# Patient Record
Sex: Female | Born: 1955 | Race: White | Hispanic: No | Marital: Married | Smoking: Never smoker
Health system: Southern US, Community
[De-identification: ages and names within clinical notes are randomized; demographics above are authoritative.]

## PROBLEM LIST (undated history)

## (undated) DIAGNOSIS — I10 Essential (primary) hypertension: Secondary | ICD-10-CM

## (undated) DIAGNOSIS — I8392 Asymptomatic varicose veins of left lower extremity: Secondary | ICD-10-CM

## (undated) DIAGNOSIS — L309 Dermatitis, unspecified: Secondary | ICD-10-CM

## (undated) DIAGNOSIS — J45909 Unspecified asthma, uncomplicated: Secondary | ICD-10-CM

## (undated) DIAGNOSIS — M545 Low back pain, unspecified: Secondary | ICD-10-CM

## (undated) DIAGNOSIS — M722 Plantar fascial fibromatosis: Secondary | ICD-10-CM

## (undated) DIAGNOSIS — K589 Irritable bowel syndrome without diarrhea: Secondary | ICD-10-CM

## (undated) DIAGNOSIS — F419 Anxiety disorder, unspecified: Secondary | ICD-10-CM

## (undated) DIAGNOSIS — K760 Fatty (change of) liver, not elsewhere classified: Secondary | ICD-10-CM

## (undated) DIAGNOSIS — E559 Vitamin D deficiency, unspecified: Secondary | ICD-10-CM

## (undated) DIAGNOSIS — F329 Major depressive disorder, single episode, unspecified: Secondary | ICD-10-CM

## (undated) DIAGNOSIS — E7800 Pure hypercholesterolemia, unspecified: Secondary | ICD-10-CM

## (undated) DIAGNOSIS — F32A Depression, unspecified: Secondary | ICD-10-CM

## (undated) DIAGNOSIS — E119 Type 2 diabetes mellitus without complications: Secondary | ICD-10-CM

## (undated) DIAGNOSIS — I700 Atherosclerosis of aorta: Secondary | ICD-10-CM

## (undated) HISTORY — DX: Pure hypercholesterolemia, unspecified: E78.00

## (undated) HISTORY — DX: Dermatitis, unspecified: L30.9

## (undated) HISTORY — DX: Irritable bowel syndrome, unspecified: K58.9

## (undated) HISTORY — PX: OTHER SURGICAL HISTORY: SHX169

## (undated) HISTORY — DX: Major depressive disorder, single episode, unspecified: F32.9

## (undated) HISTORY — DX: Plantar fascial fibromatosis: M72.2

## (undated) HISTORY — DX: Atherosclerosis of aorta: I70.0

## (undated) HISTORY — DX: Asymptomatic varicose veins of left lower extremity: I83.92

## (undated) HISTORY — DX: Anxiety disorder, unspecified: F41.9

## (undated) HISTORY — DX: Low back pain, unspecified: M54.50

## (undated) HISTORY — DX: Essential (primary) hypertension: I10

## (undated) HISTORY — DX: Low back pain: M54.5

## (undated) HISTORY — DX: Type 2 diabetes mellitus without complications: E11.9

## (undated) HISTORY — DX: Fatty (change of) liver, not elsewhere classified: K76.0

## (undated) HISTORY — PX: DILATION AND CURETTAGE OF UTERUS: SHX78

## (undated) HISTORY — DX: Vitamin D deficiency, unspecified: E55.9

## (undated) HISTORY — DX: Unspecified asthma, uncomplicated: J45.909

## (undated) HISTORY — DX: Depression, unspecified: F32.A

## (undated) HISTORY — PX: ELBOW SURGERY: SHX618

---

## 2001-09-02 ENCOUNTER — Ambulatory Visit (HOSPITAL_COMMUNITY): Admission: RE | Admit: 2001-09-02 | Discharge: 2001-09-02 | Payer: Self-pay | Admitting: Urology

## 2001-09-02 ENCOUNTER — Encounter: Payer: Self-pay | Admitting: Urology

## 2003-12-27 ENCOUNTER — Ambulatory Visit (HOSPITAL_COMMUNITY): Admission: RE | Admit: 2003-12-27 | Discharge: 2003-12-27 | Payer: Self-pay

## 2003-12-27 IMAGING — CT CT PELVIS WO/W CM
1 of 5 series · 8 of 32 positions shown, 13 images · IV contrast (CONTRAST)
Comparison: none

CLINICAL DATA: 47 year-old with recurrent urinary tract infections and hematuria.
 CT OF THE ABDOMEN AND PELVIS WITHOUT AND WITH CONTRAST ? [DATE]
 TECHNIQUE
 Initially, helical CT through the abdomen and pelvis was performed at 5mm collimation prior to IV contrast administration.  Oral contrast had been administered.  Subsequently, during the intravenous administration of [F2] Omnipaque 300, helical CT through the abdomen and pelvis was performed.  Delayed helical 5mm images through the liver and kidneys was performed at 5 minutes and 10 minutes after contrast administration.
 No comparison.
 CT OF THE ABDOMEN
 The unenhanced images demonstrate no renal or proximal ureteral calculi.  There is mild atherosclerotic calcification of the abdominal aorta.
 There is a large (approximately 6.1 x 6.0cm) lesion in the anterior segment of the right lobe of the liver; this has early peripheral enhancement and enhances towards the center on the delayed images, and is consistent with hepatic hemangioma.  There is a second lesion in the medial segment of the left lobe, which is also consistent with a hemangioma, as it enhances on the delayed images.  There is a small simple cyst in the far lateral segment of the left lobe, and there is a third small hemangioma in the lateral segment adjacent to the left intrahepatic portal vein.  No other solid lesions are identified in the liver.  The spleen has a normal appearance.  The pancreas is normal.  Both adrenal glands and both kidneys are normal on the enhanced images.  The stomach and the visualized large and small bowel are unremarkable in the upper abdomen.  There is no significant lymphadenopathy.  There is no free fluid.  The visualized lung bases appear clear.
 IMPRESSION
 1. No evidence of upper urinary tract calculi.
 2. Normal appearing kidneys.
 3. At least three liver hemangiomas, the largest on the order of 6cm in the anterior segment of the right lobe.  There is a hepatic cyst in the far lateral segment of the left lobe as well.
 CT OF THE PELVIS
 On the unenhanced images, there is no evidence of a distal ureteral calculus.  There are numerous phleboliths on both sides of the pelvis.  Uterus is normal in appearance for age.  There is a left ovarian cyst, which is on the order of 2.8 x 2.6cm.  The visualized colon and small bowel are unremarkable in the pelvis.  There is no free fluid.  There is no significant lymphadenopathy.
 1. No distal ureteral calculi.
 2. Normal appearing urinary bladder.
 3. Approximate 3cm left ovarian cyst.

[Series 7549: — · axial · 0.68mm/px · z∈[+1370,+1730]mm · 8 of 94 slices shown, 13 images]
[im 11/94  soft-tissue]
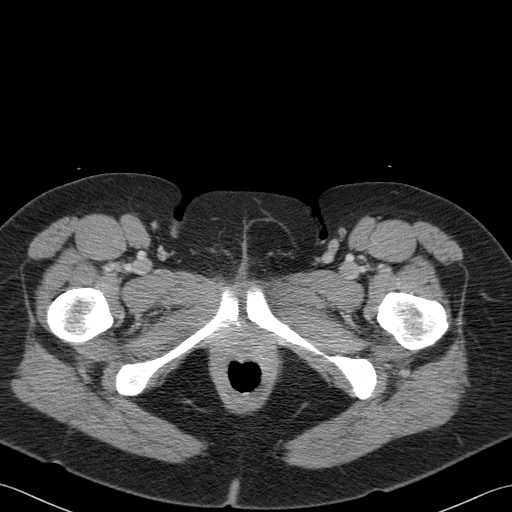
[im 11/94  bone]
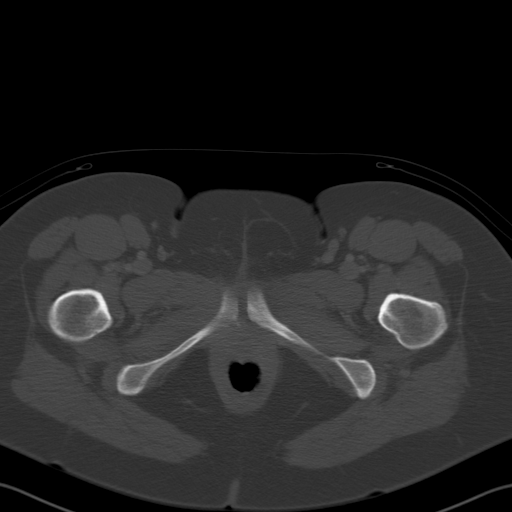
[im 21/94  soft-tissue]
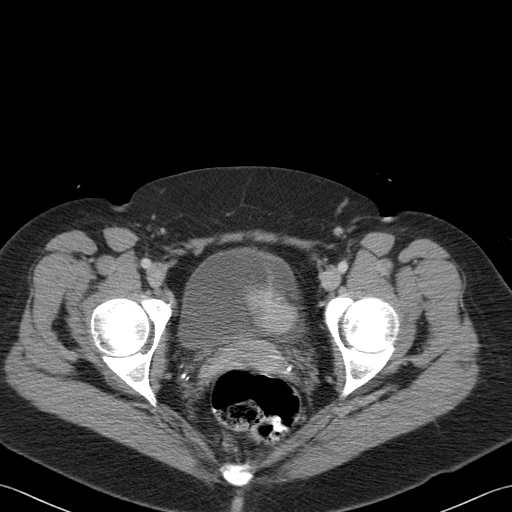
[im 32/94  soft-tissue]
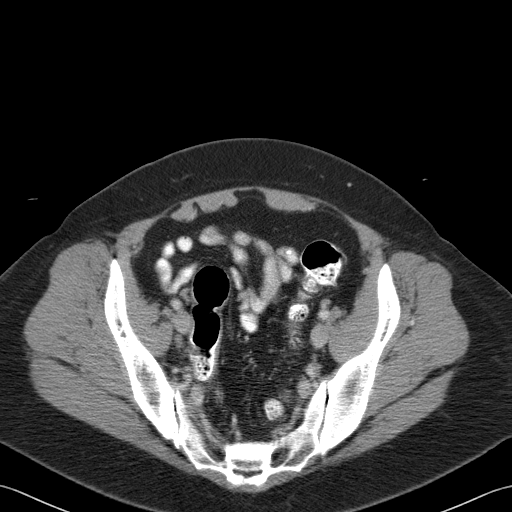
[im 42/94  soft-tissue]
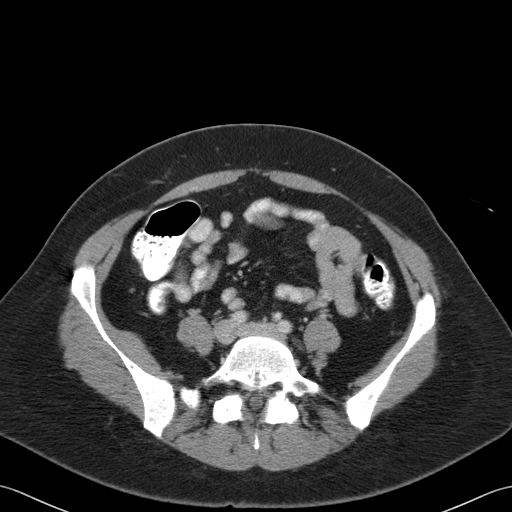
[im 52/94  soft-tissue]
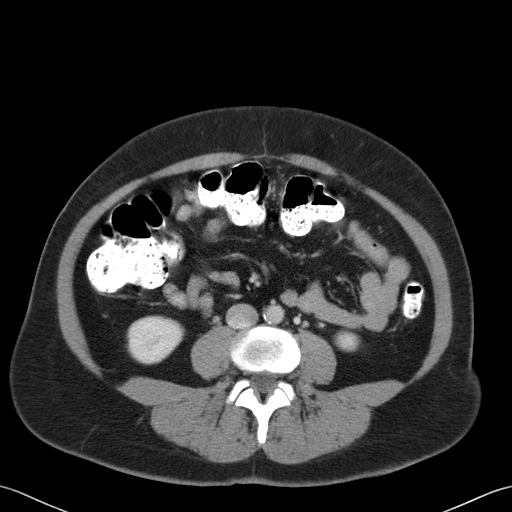
[im 52/94  lung]
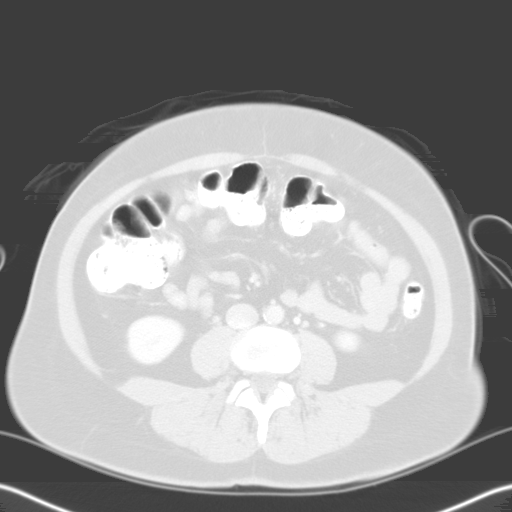
[im 63/94  soft-tissue]
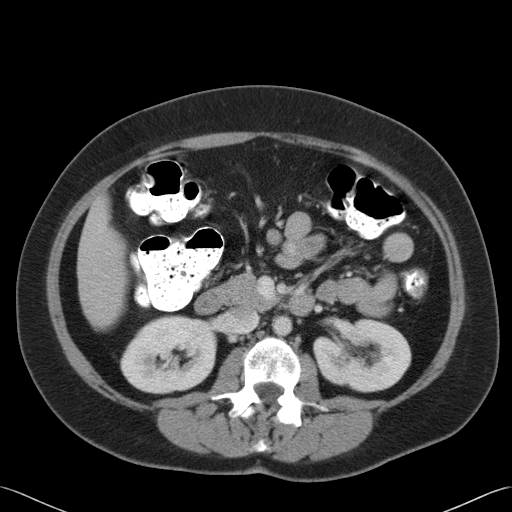
[im 63/94  lung]
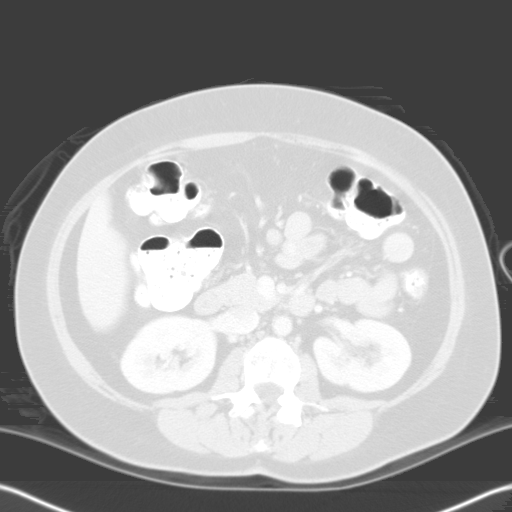
[im 73/94  soft-tissue]
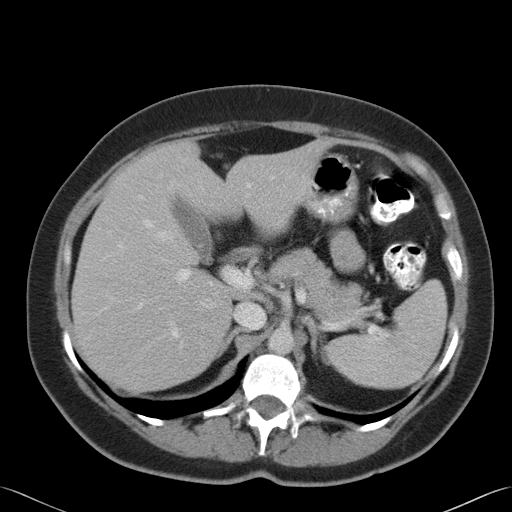
[im 73/94  lung]
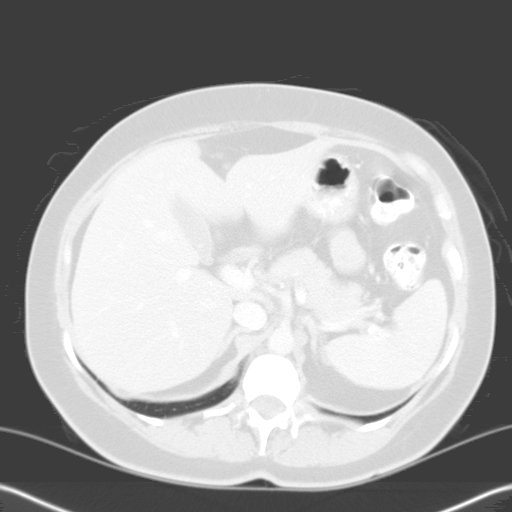
[im 83/94  soft-tissue]
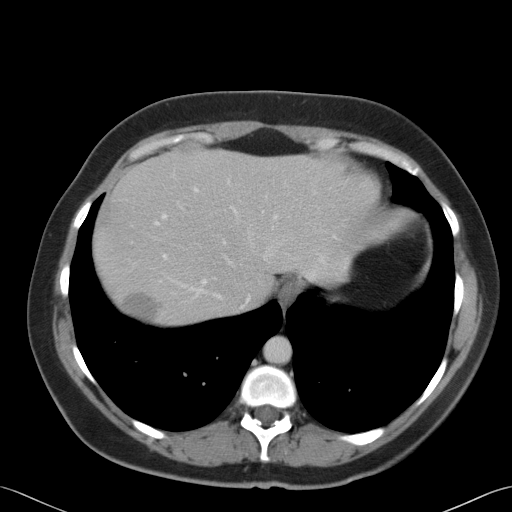
[im 83/94  lung]
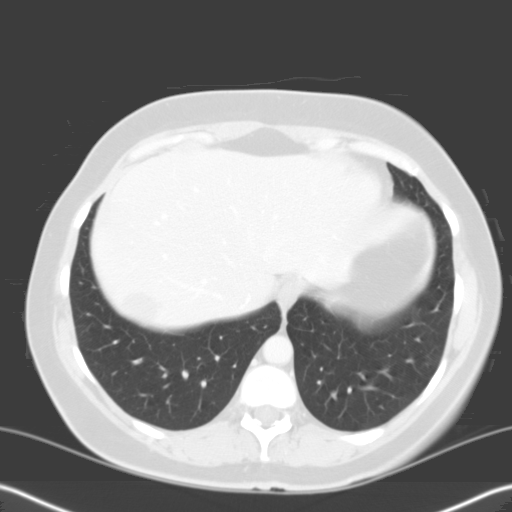

[8 of 32 positions shown; findings below may reference images not displayed]

## 2005-01-09 ENCOUNTER — Ambulatory Visit: Payer: Self-pay | Admitting: Internal Medicine

## 2005-01-16 ENCOUNTER — Ambulatory Visit: Payer: Self-pay | Admitting: *Deleted

## 2015-12-20 ENCOUNTER — Telehealth (HOSPITAL_COMMUNITY): Payer: Self-pay | Admitting: *Deleted

## 2015-12-20 NOTE — Telephone Encounter (Signed)
spoke with patient regarding an appointment.  She is scheduled at [facility], she wanted to be seen here before 02/11/16.    Appointment here will be further out.   She decided to keep appointment in [place].

## 2015-12-20 NOTE — Telephone Encounter (Signed)
left voice message regarding appointment. 

## 2016-02-11 ENCOUNTER — Ambulatory Visit (INDEPENDENT_AMBULATORY_CARE_PROVIDER_SITE_OTHER): Payer: Self-pay | Admitting: Psychiatry

## 2016-02-11 ENCOUNTER — Encounter (HOSPITAL_COMMUNITY): Payer: Self-pay | Admitting: Psychiatry

## 2016-02-11 VITALS — BP 140/88 | HR 68 | Ht 63.0 in | Wt 167.0 lb

## 2016-02-11 DIAGNOSIS — K760 Fatty (change of) liver, not elsewhere classified: Secondary | ICD-10-CM

## 2016-02-11 DIAGNOSIS — F411 Generalized anxiety disorder: Secondary | ICD-10-CM | POA: Insufficient documentation

## 2016-02-11 DIAGNOSIS — F332 Major depressive disorder, recurrent severe without psychotic features: Secondary | ICD-10-CM | POA: Insufficient documentation

## 2016-02-11 MED ORDER — PAROXETINE HCL 20 MG PO TABS: 20.0000 mg | ORAL_TABLET | Freq: Every day | ORAL | 3 refills | Status: DC

## 2016-02-11 NOTE — Progress Notes (Signed)
Psychiatric Initial Adult Assessment   Patient Identification: Sandra Willis MRN:  [id number] Date of Evaluation:  02/11/2016 Referral Source: Dr. Garlan Fillers Chief Complaint:   Chief Complaint    Depression     Visit Diagnosis:    ICD-9-CM ICD-10-CM   1. Severe episode of recurrent major depressive disorder, without psychotic features ([place]) 296.33 F33.2 PARoxetine (PAXIL) 20 MG tablet  2. GAD (generalized anxiety disorder) 300.02 F41.1 PARoxetine (PAXIL) 20 MG tablet    History of Present Illness:  States PCP no longer felt comfortable treating pt's ongoing depression and anxiety. She referred pt to psychiatry. Pt has been taking Prozac 40mg  for 5 yrs. Pt takes Klonopin once a week.   Pt states Prozac is no longer working. It was working some but lately Prozac is not managing her symptoms at all. Denies SE. She wakes up feeling worried, stressed, overwhelmed, sad and unmotivated. She is having low energy and states she could sleep anytime, anywhere. Pt is sleeping 7-8 hrs/night. Appetite is good. Pt has been having recent depression since December due to stress with her daughter in law. Since then symptoms have gotten worse. She feels depressed daily and level is about 8/10. Reports anhedonia and crying spells. Denies hopelessness and worthlessness. Denies SI/HI.   Pt has rare (2-3x/year) stress induced panic attacks.   Pt is anxious and nervous all the time. She has a hard time coping with everyday activities. Reports racing thoughts, body aches, inability to relax. She is easily overwhelmed and about once a week takes Klonopin. It helps her relax and causes her to feel sleepy. Prozac is not helping.   Associated Signs/Symptoms: Depression Symptoms:  depressed mood, anhedonia, hypersomnia, fatigue, anxiety, loss of energy/fatigue,   (Hypo) Manic Symptoms:  negative  Denies manic and hypomanic symptoms including periods of decreased need for sleep, increased energy, mood lability,  impulsivity, FOI, and excessive spending.    Anxiety Symptoms:  Excessive Worry, Panic Symptoms, denies symptoms of OCD, social anxiety and specific phobias   Psychotic Symptoms:  negative   PTSD Symptoms:Negative  Father died of stomach anuerysm suddenly. It lead to pt only psych hospitalization    Past Psychiatric History:  Dx: Depression and anxiety dx in her early 67's Meds: Zoloft, Celexa, has been on Prozac for 12 yrs Previous psychiatrist/therapist: saw one but can't recall name, PCP has been managing her depression Hospitalizations: [place] in 33 due to anxiety and depression SIB: denies Suicide attempts: denies Hx of violent behavior towards others: denies Current access to guns: one handgun that is locked up Hx of abuse: denies Military Hx: denies Hx of Seizures: denies Hx of TBI: denies   Previous Psychotropic Medications: Yes   Substance Abuse History in the last 12 months:  No.  Consequences of Substance Abuse: Negative  Past Medical History:  Past Medical History:  Diagnosis Date  . Anxiety   . Depression   . Fatty liver disease, nonalcoholic    Pt is under care of specialist  . High blood pressure   . High cholesterol     Past Surgical History:  Procedure Laterality Date  . DILATION AND CURETTAGE OF UTERUS    . ELBOW SURGERY    . hemeroidectomy      Family Psychiatric and Medical History:  Family History  Problem Relation Age of Onset  . Alcohol abuse Brother   . Personality disorder Sister   . Drug abuse Sister   . Alcohol abuse Father     Social History:  Social History   Social History  . Marital status: Married    Spouse name: N/A  . Number of children: N/A  . Years of education: N/A   Social History Main Topics  . Smoking status: Never Smoker  . Smokeless tobacco: Never Used  . Alcohol use No  . Drug use: No  . Sexual activity: Yes    Partners: Male   Other Topics Concern  . None   Social History Narrative   Pt  lives in [place] with her husband. Pt has 2 adult children and has been married 93 yrs. They got married on her 71th birthday. Born and raised in [place] by her parents. Pt has one older brother and one older sister. 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     Allergies:   Allergies  Allergen Reactions  . Statins Other (See Comments)    Diffuse pain  . Sulfa Antibiotics Rash  . Zithromax [Azithromycin] Rash    Metabolic Disorder Labs: No results found for: HGBA1C, MPG No results found for: PROLACTIN No results found for: CHOL, TRIG, HDL, CHOLHDL, VLDL, LDLCALC   Current Medications: Current Outpatient Prescriptions  Medication Sig Dispense Refill  . atenolol (TENORMIN) 25 MG tablet Take 12.5 mg by mouth daily.    . clonazePAM (KLONOPIN) 0.5 MG tablet Take 0.5 mg by mouth 2 (two) times daily as needed for anxiety.    Marland Kitchen FLUoxetine (PROZAC) 40 MG capsule Take 40 mg by mouth daily.     No current facility-administered medications for this visit.     Neurologic: Headache: No Seizure: No Paresthesias:No  Musculoskeletal: Strength & Muscle Tone: within normal limits Gait & Station: normal Patient leans: straight  Psychiatric Specialty Exam: Review of Systems  Constitutional: Negative for chills and fever.  HENT: Positive for congestion. Negative for ear pain, nosebleeds and sore throat.   Eyes: Negative for blurred vision, double vision, pain and redness.  Respiratory: Positive for cough and sputum production. Negative for hemoptysis, shortness of breath and wheezing.   Cardiovascular: Positive for leg swelling. Negative for chest pain and palpitations.  Gastrointestinal: Negative for abdominal pain, heartburn, nausea and vomiting.  Musculoskeletal: Positive for joint pain and myalgias. Negative for back pain and neck pain.  Skin: Negative for itching and rash.  Neurological: Positive for dizziness. Negative for tremors, focal weakness, seizures, loss of  consciousness, weakness and headaches.  Endo/Heme/Allergies: Negative for environmental allergies.  Psychiatric/Behavioral: Positive for depression. The patient is nervous/anxious.     Blood pressure 140/88, pulse 68, height 5\' 3"  (1.6 m), weight 167 lb (75.8 kg).Body mass index is 29.58 kg/m.  General Appearance: Fairly Groomed  Eye Contact:  Good  Speech:  Clear and Coherent and Normal Rate  Volume:  Normal  Mood:  Anxious and Depressed  Affect:  Congruent  Thought Process:  Goal Directed  Orientation:  Full (Time, Place, and Person)  Thought Content:  Logical  Suicidal Thoughts:  No  Homicidal Thoughts:  No  Memory:  Immediate;   Good Recent;   Good Remote;   Good  Judgement:  Good  Insight:  Good  Psychomotor Activity:  Normal  Concentration:  Concentration: Good  Recall:  Good  Fund of Knowledge:Good  Language: Good  Akathisia:  No  Handed:  Right  AIMS (if indicated):  n/a  Assets:  Communication Skills Desire for [place] Talents/Skills Transportation  ADL's:  Intact  Cognition: WNL  Sleep:  good    Treatment Plan Summary: Medication management and Plan see below  Assessment: MDD- recurrent, severe without psychotic features; GAD   Medication management with supportive therapy. Risks/benefits and SE of the medication discussed. Pt verbalized understanding and verbal consent obtained for treatment.  Affirm with the patient that the medications are taken as ordered. Patient expressed understanding of how their medications were to be used.  Meds: d/c Prozac Klonopin 0.5mg  po BID prn anxiety Start trial of Paxil 20mg  po qHS for depression and anxiety. Due to fatty liver will use hepatic dosing of 20mg    Labs: pt will send [place] a copy of recent labs done at PCP   Therapy: brief supportive therapy provided. Discussed psychosocial stressors in detail.   Encouraged pt to develop daily  routine and work on daily goal setting as a way to improve mood symptoms.   Consultations:  Declined therapy   Pt denies SI and is at an acute low risk for suicide. Patient told to call clinic if any problems occur. Patient advised to go to ER if they should develop SI/HI, side effects, or if symptoms worsen. Has crisis numbers to call if needed. Pt verbalized understanding.  F/up in 2-3 months or sooner if needed    Charlcie Cradle, MD 8/8/20179:40 AM

## 2016-05-14 ENCOUNTER — Encounter (HOSPITAL_COMMUNITY): Payer: Self-pay | Admitting: Psychiatry

## 2016-05-14 ENCOUNTER — Ambulatory Visit (INDEPENDENT_AMBULATORY_CARE_PROVIDER_SITE_OTHER): Payer: Self-pay | Admitting: Psychiatry

## 2016-05-14 DIAGNOSIS — Z9109 Other allergy status, other than to drugs and biological substances: Secondary | ICD-10-CM

## 2016-05-14 DIAGNOSIS — Z813 Family history of other psychoactive substance abuse and dependence: Secondary | ICD-10-CM

## 2016-05-14 DIAGNOSIS — F411 Generalized anxiety disorder: Secondary | ICD-10-CM

## 2016-05-14 DIAGNOSIS — F332 Major depressive disorder, recurrent severe without psychotic features: Secondary | ICD-10-CM

## 2016-05-14 DIAGNOSIS — Z888 Allergy status to other drugs, medicaments and biological substances status: Secondary | ICD-10-CM

## 2016-05-14 DIAGNOSIS — Z811 Family history of alcohol abuse and dependence: Secondary | ICD-10-CM

## 2016-05-14 MED ORDER — CLONAZEPAM 0.5 MG PO TABS: 0.5000 mg | ORAL_TABLET | Freq: Two times a day (BID) | ORAL | 2 refills | Status: DC | PRN

## 2016-05-14 MED ORDER — PAROXETINE HCL 20 MG PO TABS: 20.0000 mg | ORAL_TABLET | Freq: Every day | ORAL | 4 refills | Status: DC

## 2016-05-14 NOTE — Progress Notes (Signed)
[place] MD/PA/NP OP Progress Note  05/14/2016 1:44 PM Sandra Willis  MRN:  [id number]  Chief Complaint:  Chief Complaint    Depression; Follow-up     Subjective:  About 3 weeks after starting Paxil depression improved and is now resolved. Pt denies depression. Denies anhedonia, isolation, crying spells, low motivation, poor hygiene, worthlessness and hopelessness. She is motivated and is doing a lot of crafts again.  Denies SI/HI.  Sleep is good and she is getting about 8 hrs/night. Appetite is increased and she has gained 6 lbs.  Energy is good. Concentration is good.   Anxiety is also significantly improved. She gets anxious when overwhelmed. It lasts for a couple of hours and then resolves. Denies panic attacks. Pt has not taken Klonopin in 3 months.    Taking meds as prescribed and reports SE of weight gain.    HPI: Depression         This is a recurrent problem.  The current episode started more than 1 month ago.   The onset quality is gradual.   The problem occurs rarely.  The most recent episode lasted 1 hour.    The problem has been rapidly improving since onset.  Associated symptoms include appetite change.  Associated symptoms include no decreased concentration, no fatigue, no helplessness, no hopelessness, does not have insomnia, not irritable, no restlessness, no decreased interest, no body aches, no myalgias, no headaches, no indigestion, not sad and no suicidal ideas.     The symptoms are aggravated by nothing.  Past treatments include SSRIs - Selective serotonin reuptake inhibitors.  Compliance with treatment is good.  Previous treatment provided significant relief.  Past medical history includes anxiety.   Anxiety  Presents for follow-up visit. Patient reports no chest pain, compulsions, confusion, decreased concentration, depressed mood, dizziness, excessive worry, feeling of choking, hyperventilation, insomnia, irritability, malaise, muscle tension, nausea, nervous/anxious  behavior, palpitations, panic, restlessness, shortness of breath or suicidal ideas. Symptoms occur rarely. The most recent episode lasted 2 hours. The severity of symptoms is mild. The quality of sleep is good. Nighttime awakenings: occasional.   Compliance with medications is 76-100%.    Visit Diagnosis:    ICD-9-CM ICD-10-CM   1. Severe episode of recurrent major depressive disorder, without psychotic features ([place]) 296.33 F33.2 PARoxetine (PAXIL) 20 MG tablet  2. GAD (generalized anxiety disorder) 300.02 F41.1 PARoxetine (PAXIL) 20 MG tablet    Past Psychiatric History:  Dx: Depression and anxiety dx in her early 1's Meds: Zoloft, Celexa, has been on Prozac for 12 yrs Previous psychiatrist/therapist: saw one but can't recall name, PCP has been managing her depression Hospitalizations: [place] in 42 due to anxiety and depression SIB: denies Suicide attempts: denies Hx of violent behavior towards others: denies Current access to guns: one handgun that is locked up Hx of abuse: denies Military Hx: denies Hx of Seizures: denies Hx of TBI: denies   Previous Psychotropic Medications: Yes   Substance Abuse History in the last 12 months:  No.  Consequences of Substance Abuse: Negative   Past Medical History:  Past Medical History:  Diagnosis Date  . Anxiety   . Depression   . Fatty liver disease, nonalcoholic    Pt is under care of specialist  . High blood pressure   . High cholesterol     Past Surgical History:  Procedure Laterality Date  . DILATION AND CURETTAGE OF UTERUS    . ELBOW SURGERY    . hemeroidectomy      Family  Psychiatric and Medical History:  Family History  Problem Relation Age of Onset  . Alcohol abuse Brother   . Personality disorder Sister   . Drug abuse Sister   . Alcohol abuse Father     Social History:  Social History   Social History  . Marital status: Married    Spouse name: N/A  . Number of children: N/A  . Years of education:  N/A   Social History Main Topics  . Smoking status: Never Smoker  . Smokeless tobacco: Never Used  . Alcohol use No  . Drug use: No  . Sexual activity: Yes    Partners: Male   Other Topics Concern  . None   Social History Narrative   Pt lives in [place] with her husband. Pt has 2 adult children and has been married 32 yrs. They got married on her 88th birthday. Born and raised in [place] by her parents. Pt has one older brother and one older sister. 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     Allergies:  Allergies  Allergen Reactions  . Statins Other (See Comments)    Diffuse pain  . Sulfa Antibiotics Rash  . Zithromax [Azithromycin] Rash    Metabolic Disorder Labs: No results found for: HGBA1C, MPG No results found for: PROLACTIN No results found for: CHOL, TRIG, HDL, CHOLHDL, VLDL, LDLCALC   Current Medications: Current Outpatient Prescriptions  Medication Sig Dispense Refill  . atenolol (TENORMIN) 25 MG tablet Take 12.5 mg by mouth daily.    . clonazePAM (KLONOPIN) 0.5 MG tablet Take 0.5 mg by mouth 2 (two) times daily as needed for anxiety.    Marland Kitchen PARoxetine (PAXIL) 20 MG tablet Take 1 tablet (20 mg total) by mouth daily. 30 tablet 3   No current facility-administered medications for this visit.       Musculoskeletal: Strength & Muscle Tone: within normal limits Gait & Station: normal Patient leans: N/A  Psychiatric Specialty Exam: Review of Systems  Constitutional: Positive for appetite change. Negative for chills, fatigue, fever, irritability and weight loss.  HENT: Positive for congestion and sinus pain. Negative for hearing loss and tinnitus.   Eyes: Negative for blurred vision, double vision, photophobia and pain.  Respiratory: Positive for cough. Negative for shortness of breath and wheezing.   Cardiovascular: Negative for chest pain, palpitations and leg swelling.  Gastrointestinal: Negative for abdominal pain, heartburn, nausea and  vomiting.  Musculoskeletal: Negative for back pain, joint pain, myalgias and neck pain.  Skin: Negative for itching and rash.  Neurological: Negative for dizziness, tremors, sensory change, seizures, loss of consciousness and headaches.  Endo/Heme/Allergies: Positive for environmental allergies. Negative for polydipsia. Does not bruise/bleed easily.  Psychiatric/Behavioral: Positive for depression. Negative for confusion, decreased concentration, hallucinations, substance abuse and suicidal ideas. The patient is not nervous/anxious and does not have insomnia.     Blood pressure 126/70, pulse 70, height 5\' 4"  (1.626 m), weight 174 lb (78.9 kg).Body mass index is 29.87 kg/m.  General Appearance: Fairly Groomed  Eye Contact:  Good  Speech:  Clear and Coherent and Normal Rate  Volume:  Normal  Mood:  Euthymic  Affect:  Congruent  Thought Process:  Goal Directed  Orientation:  Full (Time, Place, and Person)  Thought Content: WDL   Suicidal Thoughts:  No  Homicidal Thoughts:  No  Memory:  Immediate;   Good Recent;   Good Remote;   Good  Judgement:  Fair  Insight:  Fair  Psychomotor Activity:  Normal  Concentration:  Concentration: Good  Recall:  Good  Fund of Knowledge: Good  Language: Good  Akathisia:  No  Handed:  Right  AIMS (if indicated):  n/a  Assets:  Communication Skills Desire for Improvement Housing Resilience Social Support Talents/Skills Transportation Vocational/Educational  ADL's:  Intact  Cognition: WNL  Sleep:  good     Treatment Plan Summary:Medication management and Plan see below   Assessment: MDD- recurrent, severe without psychotic features; GAD   Medication management with supportive therapy. Risks/benefits and SE of the medication discussed. Pt verbalized understanding and verbal consent obtained for treatment.  Affirm with the patient that the medications are taken as ordered. Patient expressed understanding of how their medications were to be  used.  Meds: Klonopin 0.5mg  po BID prn anxiety Paxil 20mg  po qHS for depression and anxiety. Due to fatty liver will use hepatic dosing of 20mg . Advised pt on diet for weight loss   Labs: pt will send [place] a copy of recent labs done at PCP   Therapy: brief supportive therapy provided. Discussed psychosocial stressors in detail.   Encouraged pt to develop daily routine and work on daily goal setting as a way to improve mood symptoms.   Consultations:  Declined therapy   Pt denies SI and is at an acute low risk for suicide. Patient told to call clinic if any problems occur. Patient advised to go to ER if they should develop SI/HI, side effects, or if symptoms worsen. Has crisis numbers to call if needed. Pt verbalized understanding.  F/up in 3 months or sooner if needed   Charlcie Cradle, MD 05/14/2016, 1:44 PM

## 2016-09-17 ENCOUNTER — Encounter (HOSPITAL_COMMUNITY): Payer: Self-pay | Admitting: Psychiatry

## 2016-09-17 ENCOUNTER — Ambulatory Visit (INDEPENDENT_AMBULATORY_CARE_PROVIDER_SITE_OTHER): Payer: BLUE CROSS/BLUE SHIELD | Admitting: Psychiatry

## 2016-09-17 DIAGNOSIS — Z882 Allergy status to sulfonamides status: Secondary | ICD-10-CM

## 2016-09-17 DIAGNOSIS — Z811 Family history of alcohol abuse and dependence: Secondary | ICD-10-CM | POA: Diagnosis not present

## 2016-09-17 DIAGNOSIS — F411 Generalized anxiety disorder: Secondary | ICD-10-CM

## 2016-09-17 DIAGNOSIS — Z888 Allergy status to other drugs, medicaments and biological substances status: Secondary | ICD-10-CM | POA: Diagnosis not present

## 2016-09-17 DIAGNOSIS — F332 Major depressive disorder, recurrent severe without psychotic features: Secondary | ICD-10-CM

## 2016-09-17 DIAGNOSIS — Z818 Family history of other mental and behavioral disorders: Secondary | ICD-10-CM | POA: Diagnosis not present

## 2016-09-17 DIAGNOSIS — Z79899 Other long term (current) drug therapy: Secondary | ICD-10-CM | POA: Diagnosis not present

## 2016-09-17 DIAGNOSIS — Z813 Family history of other psychoactive substance abuse and dependence: Secondary | ICD-10-CM | POA: Diagnosis not present

## 2016-09-17 DIAGNOSIS — F3340 Major depressive disorder, recurrent, in remission, unspecified: Secondary | ICD-10-CM

## 2016-09-17 MED ORDER — PAROXETINE HCL 20 MG PO TABS: 20.0000 mg | ORAL_TABLET | Freq: Every day | ORAL | 3 refills | Status: DC

## 2016-09-17 NOTE — Progress Notes (Signed)
[facility] MD/PA/NP OP Progress Note  09/17/2016 1:50 PM Sandra Willis  MRN:  [id number]  Chief Complaint:  Chief Complaint    Follow-up    HPI: "I feel great".   Pt states depression has resolved. Reports anhedonia has resolved. She is back into flowers, crocheting, sewing, reading and crafts. Sleep is good. Pt reports good energy and denies low motivation. Denies hopelessness and worthlessness. Denies SI/HI. Pt states symptoms improved about 2 weeks after starting Paxil.  She denies anxiety and panic attacks. Pt last took Klonopin 2 months ago.   Taking meds as prescribed and endorsing SE of weight gain. Pt has started dieting.    Visit Diagnosis:    ICD-9-CM ICD-10-CM   1. Severe episode of recurrent major depressive disorder, without psychotic features ([place]) 296.33 F33.2 PARoxetine (PAXIL) 20 MG tablet  2. GAD (generalized anxiety disorder) 300.02 F41.1 PARoxetine (PAXIL) 20 MG tablet    Past Psychiatric History: see H&P  Past Medical History:  Past Medical History:  Diagnosis Date  . Anxiety   . Depression   . Fatty liver disease, nonalcoholic    Pt is under care of specialist  . High blood pressure   . High cholesterol     Past Surgical History:  Procedure Laterality Date  . DILATION AND CURETTAGE OF UTERUS    . ELBOW SURGERY    . hemeroidectomy      Family Psychiatric History:  Family History  Problem Relation Age of Onset  . Alcohol abuse Brother   . Personality disorder Sister   . Drug abuse Sister   . Alcohol abuse Father     Social History:  Social History   Social History  . Marital status: Married    Spouse name: N/A  . Number of children: N/A  . Years of education: N/A   Social History Main Topics  . Smoking status: Never Smoker  . Smokeless tobacco: Never Used  . Alcohol use No  . Drug use: No  . Sexual activity: Yes    Partners: Male   Other Topics Concern  . None   Social History Narrative   Pt lives in [place] with her husband. Pt has 2  adult children and has been married 18 yrs. They got married on her 15th birthday. Born and raised in [place] by her parents. Pt has one older brother and one older sister. 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     Allergies:  Allergies  Allergen Reactions  . Statins Other (See Comments)    Diffuse pain  . Sulfa Antibiotics Rash  . Zithromax [Azithromycin] Rash    Metabolic Disorder Labs: No results found for: HGBA1C, MPG No results found for: PROLACTIN No results found for: CHOL, TRIG, HDL, CHOLHDL, VLDL, LDLCALC   Current Medications: Current Outpatient Prescriptions  Medication Sig Dispense Refill  . atenolol (TENORMIN) 25 MG tablet Take 12.5 mg by mouth daily.    . clonazePAM (KLONOPIN) 0.5 MG tablet Take 1 tablet (0.5 mg total) by mouth 2 (two) times daily as needed for anxiety. 30 tablet 2  . ergocalciferol (VITAMIN D2) 50000 units capsule Take 50,000 Units by mouth once a week.    . Multiple Vitamin (MULTIVITAMIN) tablet Take 1 tablet by mouth daily.    Marland Kitchen PARoxetine (PAXIL) 20 MG tablet Take 1 tablet (20 mg total) by mouth daily. 30 tablet 4   No current facility-administered medications for this visit.       Musculoskeletal: Strength &  Muscle Tone: within normal limits Gait & Station: normal Patient leans: N/A  Psychiatric Specialty Exam: Review of Systems  Constitutional: Negative for chills, fever and malaise/fatigue.  HENT: Negative for congestion, nosebleeds, sinus pain, sore throat and tinnitus.   Neurological: Negative for weakness.  Psychiatric/Behavioral: Negative for depression, hallucinations, substance abuse and suicidal ideas. The patient is not nervous/anxious and does not have insomnia.     Blood pressure 129/82, pulse 65, resp. rate 12, height 5\' 3"  (1.6 m), weight 177 lb 3.2 oz (80.4 kg).Body mass index is 31.39 kg/m.  General Appearance: Fairly Groomed  Eye Contact:  Good  Speech:  Clear and Coherent and Normal Rate  Volume:   Normal  Mood:  Euthymic  Affect:  Congruent  Thought Process:  Goal Directed and Descriptions of Associations: Intact  Orientation:  Full (Time, Place, and Person)  Thought Content: WDL   Suicidal Thoughts:  No  Homicidal Thoughts:  No  Memory:  Immediate;   Good Recent;   Good Remote;   Good  Judgement:  Good  Insight:  Good  Psychomotor Activity:  Normal  Concentration:  Concentration: Good and Attention Span: Good  Recall:  Good  Fund of Knowledge: Good  Language: Good  Akathisia:  No  Handed:  Right  AIMS (if indicated):  n/a  Assets:  Communication Skills Desire for Improvement Financial Resources/Insurance Housing Leisure Time [place] Talents/Skills Transportation Vocational/Educational  ADL's:  Intact  Cognition: WNL  Sleep:  good     Treatment Plan Summary:Medication management and Plan see below   Assessment: MDD-recurrent- in early remission; GAD   Medication management with supportive therapy. Risks/benefits and SE of the medication discussed. Pt verbalized understanding and verbal consent obtained for treatment.  Affirm with the patient that the medications are taken as ordered. Patient expressed understanding of how their medications were to be used.  Meds: d/c Klonopin as pt reports she no longer has anxiety and has not taken in 2 months Will continue Paxil 20mg  po qD for depression and anxiety as pt reports symptoms are stable or nearly resolved.   Labs: none   Therapy: brief supportive therapy provided. Discussed psychosocial stressors in detail.     Consultations: none  Pt denies SI and is at an acute low risk for suicide. Patient told to call clinic if any problems occur. Patient advised to go to ER if they should develop SI/HI, side effects, or if symptoms worsen. Has crisis numbers to call if needed. Pt verbalized understanding.  F/up in 3 months or sooner if needed   Charlcie Cradle, MD 09/17/2016, 1:50  PM

## 2016-11-29 ENCOUNTER — Other Ambulatory Visit (HOSPITAL_COMMUNITY): Payer: Self-pay | Admitting: Psychiatry

## 2016-11-29 DIAGNOSIS — F411 Generalized anxiety disorder: Secondary | ICD-10-CM

## 2016-11-29 DIAGNOSIS — F332 Major depressive disorder, recurrent severe without psychotic features: Secondary | ICD-10-CM

## 2016-12-17 ENCOUNTER — Ambulatory Visit (HOSPITAL_COMMUNITY): Payer: Self-pay | Admitting: Psychiatry

## 2017-03-17 ENCOUNTER — Other Ambulatory Visit (HOSPITAL_COMMUNITY): Payer: Self-pay | Admitting: Psychiatry

## 2017-03-17 DIAGNOSIS — F411 Generalized anxiety disorder: Secondary | ICD-10-CM

## 2017-03-17 DIAGNOSIS — F332 Major depressive disorder, recurrent severe without psychotic features: Secondary | ICD-10-CM

## 2017-04-20 ENCOUNTER — Other Ambulatory Visit (HOSPITAL_COMMUNITY): Payer: Self-pay | Admitting: Psychiatry

## 2017-04-20 DIAGNOSIS — F411 Generalized anxiety disorder: Secondary | ICD-10-CM

## 2017-04-20 DIAGNOSIS — F332 Major depressive disorder, recurrent severe without psychotic features: Secondary | ICD-10-CM

## 2017-07-29 ENCOUNTER — Encounter: Payer: Self-pay | Admitting: *Deleted

## 2017-07-30 ENCOUNTER — Ambulatory Visit: Payer: BLUE CROSS/BLUE SHIELD | Admitting: Diagnostic Neuroimaging

## 2017-07-30 ENCOUNTER — Encounter: Payer: Self-pay | Admitting: Diagnostic Neuroimaging

## 2017-07-30 VITALS — BP 145/87 | HR 67 | Ht 63.0 in | Wt 179.6 lb

## 2017-07-30 DIAGNOSIS — M79602 Pain in left arm: Secondary | ICD-10-CM | POA: Diagnosis not present

## 2017-07-30 DIAGNOSIS — R200 Anesthesia of skin: Secondary | ICD-10-CM | POA: Diagnosis not present

## 2017-07-30 DIAGNOSIS — M79601 Pain in right arm: Secondary | ICD-10-CM

## 2017-07-30 DIAGNOSIS — R202 Paresthesia of skin: Secondary | ICD-10-CM

## 2017-07-30 NOTE — Progress Notes (Signed)
[facility]  PATIENT: Sandra Willis DOB: 11-21-1955  REFERRING CLINICIAN: Francesco Runner, MD HISTORY FROM: patient  REASON FOR VISIT: new consult    HISTORICAL  CHIEF COMPLAINT:  Chief Complaint  Patient presents with  . NP  Dr. Woody Seller  . Carpal Tunnel    numbness/ tingling/ burning sensations in bilateral arms/ finger, w/ pain and weakness.  Started about 6 months ago.    HISTORY OF PRESENT ILLNESS:   62 year old female here for evaluation of numbness and tingling in bilateral hands.  For past 6 months patient has onset of bilateral fingers, hands, burning sensation.  This progressed to pain and weakness in her upper extremities, mainly in her bilateral elbow and triceps regions.  Patient feels weakness in her proximal upper extremities.  No problems with her feet or legs.  No low back pain.  2 years ago she had neck pain radiating to the right arm which has resolved.  Pain and numbness is limiting her day-to-day functioning.  She is not able to lift items like previously.  She is having difficulty with tasks such as knitting and crocheting.   REVIEW OF SYSTEMS: Full 14 system review of systems performed and negative with exception of: Numbness weakness too much sleep decreased energy joint pain aching muscles shortness of breath blurred vision weight gain fatigue.  ALLERGIES: Allergies  Allergen Reactions  . Statins Other (See Comments)    Diffuse pain, Simvastatin  . Sulfa Antibiotics Rash    bactrim  . Zithromax [Azithromycin] Rash    HOME MEDICATIONS: Outpatient Medications Prior to Visit  Medication Sig Dispense Refill  . aspirin EC 81 MG tablet Take 81 mg by mouth daily.    Marland Kitchen atenolol-chlorthalidone (TENORETIC) 50-25 MG tablet Take 0.5 tablets by mouth daily.    . cetirizine (ZYRTEC) 10 MG tablet Take 10 mg by mouth daily.    . diclofenac (VOLTAREN) 75 MG EC tablet Take 75 mg by mouth daily.    . ergocalciferol (VITAMIN D2) 50000 units capsule Take  50,000 Units by mouth once a week.    . fenofibrate (TRICOR) 145 MG tablet Take 145 mg by mouth daily.    . Multiple Vitamin (MULTIVITAMIN) tablet Take 1 tablet by mouth daily.    Marland Kitchen PARoxetine (PAXIL) 20 MG tablet Take 1 tablet (20 mg total) by mouth daily. 30 tablet 3  . gabapentin (NEURONTIN) 300 MG capsule Take 300 mg by mouth at bedtime.     No facility-administered medications prior to visit.     PAST MEDICAL HISTORY: Past Medical History:  Diagnosis Date  . Anxiety   . Depression   . Dermatitis    due to plants (poison ivy, sumac, oak)  . Diabetes ([place])   . Fatty liver disease, nonalcoholic    Pt is under care of specialist  . High blood pressure   . High cholesterol   . Low back pain   . Varicose veins of left lower extremity   . Vitamin D deficiency     PAST SURGICAL HISTORY: Past Surgical History:  Procedure Laterality Date  . cataracts Bilateral   . DILATION AND CURETTAGE OF UTERUS    . ELBOW SURGERY    . hemeroidectomy      FAMILY HISTORY: Family History  Problem Relation Age of Onset  . Alcohol abuse Brother   . Personality disorder Sister   . Drug abuse Sister   . Diabetes Sister   . Alcohol abuse Father   . Heart disease Father   .  High Cholesterol Father   . Hypertension Father   . Cancer Mother   . Diabetes Mother     SOCIAL HISTORY:  Social History   Socioeconomic History  . Marital status: Married    Spouse name: Not on file  . Number of children: Not on file  . Years of education: Not on file  . Highest education level: Not on file  Social Needs  . Financial resource strain: Not on file  . Food insecurity - worry: Not on file  . Food insecurity - inability: Not on file  . Transportation needs - medical: Not on file  . Transportation needs - non-medical: Not on file  Occupational History  . Not on file  Tobacco Use  . Smoking status: Never Smoker  . Smokeless tobacco: Never Used  Substance and Sexual Activity  . Alcohol use: No     Comment: quit 2000  . Drug use: No  . Sexual activity: Yes    Partners: Male  Other Topics Concern  . Not on file  Social History Narrative   Pt lives in [place] with her husband. Pt has 2 adult children and has been married 63 yrs. They got married on her 55th birthday. Born and raised in [place] by her parents. Pt has one older brother and one older sister. 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 Caffeine 2 cups coffee on sat/ sun.  Sodas 1-2 daily during week.   Education 12th grade.      PHYSICAL EXAM  GENERAL EXAM/CONSTITUTIONAL: Vitals:  Vitals:   07/30/17 0820  BP: (!) 145/87  Pulse: 67  Weight: 179 lb 9.6 oz (81.5 kg)  Height: 5\' 3"  (1.6 m)     Body mass index is 31.81 kg/m.  Visual Acuity Screening   Right eye Left eye Both eyes  Without correction: 20/40 20/50   With correction:        Patient is in no distress; well developed, nourished and groomed; neck is supple  CARDIOVASCULAR:  Examination of carotid arteries is normal; no carotid bruits  Regular rate and rhythm, no murmurs  Examination of peripheral vascular system by observation and palpation is normal  EYES:  Ophthalmoscopic exam of optic discs and posterior segments is normal; no papilledema or hemorrhages  MUSCULOSKELETAL:  Gait, strength, tone, movements noted in Neurologic exam below  NEUROLOGIC: MENTAL STATUS:  No flowsheet data found.  awake, alert, oriented to person, place and time  recent and remote memory intact  normal attention and concentration  language fluent, comprehension intact, naming intact,   fund of knowledge appropriate  CRANIAL NERVE:   2nd - no papilledema on fundoscopic exam  2nd, 3rd, 4th, 6th - pupils equal and reactive to light, visual fields full to confrontation, extraocular muscles intact, no nystagmus  5th - facial sensation symmetric  7th - facial strength symmetric  8th - hearing intact  9th - palate elevates  symmetrically, uvula midline  11th - shoulder shrug symmetric  12th - tongue protrusion midline  MOTOR:   normal bulk and tone, full strength in the BUE, BLE; BUE TESTING TRIGGERS PAIN IN UPPER ARMS  SENSORY:   normal and symmetric to light touch, temperature, vibration  DECR PP IN FINGERS COMPARED TO FACE AND UPPER ARMS  POSITIVE PHALENS  NEGATIVE TINELS  COORDINATION:   finger-nose-finger, fine finger movements normal  REFLEXES:   deep tendon reflexes --> BUE 2; KNEES 3; ANKLES 2 (WITH jendrassik maneuver)  POSITIVE SUPRA-PATELLAR REFLEXES  NEGATIVE CROSSED ADDUCTOR REFLEXES  NEGATIVE HOFFMANS  DOWN GOING TOES  GAIT/STATION:   narrow based gait; able to walk on toes, heels and tandem; romberg is negative    DIAGNOSTIC DATA (LABS, IMAGING, TESTING) - I reviewed patient records, labs, notes, testing and imaging myself where available.  No results found for: WBC, HGB, HCT, MCV, PLT No results found for: NA, K, CL, CO2, GLUCOSE, BUN, CREATININE, CALCIUM, PROT, ALBUMIN, AST, ALT, ALKPHOS, BILITOT, GFRNONAA, GFRAA No results found for: CHOL, HDL, LDLCALC, LDLDIRECT, TRIG, CHOLHDL No results found for: HGBA1C No results found for: VITAMINB12 No results found for: TSH      ASSESSMENT AND PLAN  62 y.o. year old female here with new onset of burning pain and numbness in her bilateral hands, with weakness and pain in her proximal upper extremities, elbows and triceps regions, concerning for cervical radiculopathies versus peripheral neuropathy.  Will proceed with further workup.   Ddx: cervical radiculopathies vs carpal tunnel syndrome vs ulnar neuropathy vs other peripheral neuropathy  1. Numbness and tingling in both hands   2. Pain in both upper extremities      PLAN:  - EMG/NCS - MRI cervical spine - further treatment after results  Orders Placed This Encounter  Procedures  . MR CERVICAL SPINE WO CONTRAST  . NCV with EMG(electromyography)    Return for for NCV/EMG.    Penni Bombard, MD 5/32/9924, 2:68 AM Certified in Neurology, Neurophysiology and Neuroimaging  [facility] [address] [place], [place] [address] [phone]

## 2017-07-30 NOTE — Patient Instructions (Signed)
-   EMG/NCS (electrical nerve testing)  - MRI cervical spine  - further treatment after results

## 2017-08-10 ENCOUNTER — Ambulatory Visit: Payer: BLUE CROSS/BLUE SHIELD

## 2017-08-10 DIAGNOSIS — R202 Paresthesia of skin: Secondary | ICD-10-CM

## 2017-08-10 DIAGNOSIS — M79601 Pain in right arm: Secondary | ICD-10-CM

## 2017-08-10 DIAGNOSIS — R200 Anesthesia of skin: Secondary | ICD-10-CM | POA: Diagnosis not present

## 2017-08-10 DIAGNOSIS — M79602 Pain in left arm: Secondary | ICD-10-CM

## 2017-08-12 ENCOUNTER — Telehealth: Payer: Self-pay | Admitting: *Deleted

## 2017-08-12 ENCOUNTER — Ambulatory Visit (INDEPENDENT_AMBULATORY_CARE_PROVIDER_SITE_OTHER): Payer: BLUE CROSS/BLUE SHIELD | Admitting: Diagnostic Neuroimaging

## 2017-08-12 ENCOUNTER — Encounter: Payer: BLUE CROSS/BLUE SHIELD | Admitting: Diagnostic Neuroimaging

## 2017-08-12 DIAGNOSIS — R202 Paresthesia of skin: Secondary | ICD-10-CM

## 2017-08-12 DIAGNOSIS — Z0289 Encounter for other administrative examinations: Secondary | ICD-10-CM

## 2017-08-12 DIAGNOSIS — M79601 Pain in right arm: Secondary | ICD-10-CM

## 2017-08-12 DIAGNOSIS — M79602 Pain in left arm: Secondary | ICD-10-CM

## 2017-08-12 DIAGNOSIS — R200 Anesthesia of skin: Secondary | ICD-10-CM | POA: Diagnosis not present

## 2017-08-12 DIAGNOSIS — G5603 Carpal tunnel syndrome, bilateral upper limbs: Secondary | ICD-10-CM

## 2017-08-12 NOTE — Telephone Encounter (Signed)
LVM informing patient that her MRI cervical spine showed unremarkable imaging.  Advised she had her EMG today and should have gotten results from Dr Leta Baptist during the study/testing. Left office number for any questions.

## 2017-08-16 NOTE — Telephone Encounter (Signed)
Patient has questions about MRI results. °

## 2017-08-16 NOTE — Addendum Note (Signed)
Addended by: Andrey Spearman R on: 08/16/2017 01:22 PM   Modules accepted: Orders

## 2017-08-16 NOTE — Telephone Encounter (Signed)
Called patient and reviewed unremarkable MRI cervical spine results. She stated that she wondered what is causing her upper arm numbness. This RN advised there is nothing in her MRI to explain that issue. She stated that Dr Leta Baptist informed her she has bilateral carpal tunnel, and he was going to refer her to a Copy. She is requesting that referral be placed. Will route to Dr Leta Baptist.

## 2017-08-16 NOTE — Procedures (Signed)
[facility]  NCS (NERVE CONDUCTION STUDY) WITH EMG (ELECTROMYOGRAPHY) REPORT   STUDY DATE: 08/16/17 PATIENT NAME: Sandra Willis DOB: [birth year] MRN: [id number]  ORDERING CLINICIAN: Andrey Spearman, MD   TECHNOLOGIST: Oneita Jolly ELECTROMYOGRAPHER: Earlean Polka. Shuronda Santino, MD  CLINICAL INFORMATION: 62 year old female with numbness in hands.  FINDINGS: NERVE CONDUCTION STUDY: Left median motor response is prolonged distal latency 4.8 ms, normal amplitude, normal conduction velocity.  Right median motor response is prolonged distal latency (6.9 ms) decreased amplitude, normal conduction velocity.  Bilateral ulnar motor responses and F wave latencies are normal.  Left median sensory response has prolonged peak latency and normal amplitude.  Right median sensory response has prolonged peak latency and decreased amplitude.  Bilateral ulnar sensory responses are normal.   NEEDLE ELECTROMYOGRAPHY:  Right deltoid, biceps, triceps, flexor carpi radialis, first dorsal interosseous, right C6-7 paraspinal muscles are normal.   IMPRESSION:   Abnormal study demonstrating: - Bilateral median neuropathies at the wrist consistent with bilateral carpal tunnel syndrome.  This is mild on the left and moderate on the right in severity.    INTERPRETING PHYSICIAN:  Penni Bombard, MD Certified in Neurology, Neurophysiology and Neuroimaging  [facility] [address] [place], [place] [address] [phone]   [facility]    Nerve / Sites Muscle Latency Ref. Amplitude Ref. Rel Amp Segments Distance Velocity Ref. Area    ms ms mV mV %  cm m/s m/s mVms  L Median - APB     Wrist APB 4.8 ?4.4 4.4 ?4.0 100 Wrist - APB 7   16.2     Upper arm APB 8.9  4.1  93.1 Upper arm - Wrist 21 52 ?49 15.4  R Median - APB     Wrist APB 6.9 ?4.4 3.5 ?4.0 100 Wrist - APB 7   12.2     Upper arm APB 10.5  3.0  85.9 Upper arm - Wrist 20 56 ?49 11.8  L Ulnar - ADM      Wrist ADM 2.4 ?3.3 6.6 ?6.0 100 Wrist - ADM 7   23.4     B.Elbow ADM 5.5  5.9  88.5 B.Elbow - Wrist 18 59 ?49 21.3     A.Elbow ADM 7.1  5.6  95.9 A.Elbow - B.Elbow 10 60 ?49 20.6         A.Elbow - Wrist      R Ulnar - ADM     Wrist ADM 2.3 ?3.3 5.7 ?6.0 100 Wrist - ADM 7   18.3     B.Elbow ADM 5.2  5.3  93 B.Elbow - Wrist 17 59 ?49 17.9     A.Elbow ADM 6.9  5.2  99.5 A.Elbow - B.Elbow 10 58 ?49 18.2         A.Elbow - Wrist                 SNC    Nerve / Sites Rec. Site Peak Lat Ref.  Amp Ref. Segments Distance    ms ms V V  cm  L Median - Orthodromic (Dig II, Mid palm)     Dig II Wrist 4.4 ?3.4 10 ?10 Dig II - Wrist 13  R Median - Orthodromic (Dig II, Mid palm)     Dig II Wrist 6.1 ?3.4 2 ?10 Dig II - Wrist 13  L Ulnar - Orthodromic, (Dig V, Mid palm)     Dig V Wrist 2.5 ?3.1 6 ?5 Dig V - Wrist 11  R  Ulnar - Orthodromic, (Dig V, Mid palm)     Dig V Wrist 2.4 ?3.1 14 ?5 Dig V - Wrist 59              F  Wave    Nerve F Lat Ref.   ms ms  L Ulnar - ADM 25.2 ?32.0  R Ulnar - ADM 24.4 ?32.0         EMG full       EMG Summary Table    Spontaneous MUAP Recruitment  Muscle IA Fib PSW Fasc Other Amp Dur. Poly Pattern  R. Deltoid Normal None None None _______ Normal Normal Normal Normal  R. Biceps brachii Normal None None None _______ Normal Normal Normal Normal  R. Triceps brachii Normal None None None _______ Normal Normal Normal Normal  R. Flexor carpi radialis Normal None None None _______ Normal Normal Normal Normal  R. First dorsal interosseous Normal None None None _______ Normal Normal Normal Normal  R. Cervical paraspinals Normal None None None _______ Normal Normal Normal Normal

## 2018-07-06 HISTORY — PX: CARPAL TUNNEL RELEASE: SHX101

## 2019-09-22 ENCOUNTER — Encounter: Payer: Self-pay | Admitting: Cardiology

## 2019-09-22 ENCOUNTER — Ambulatory Visit: Payer: BC Managed Care – PPO | Admitting: Cardiology

## 2019-09-22 ENCOUNTER — Other Ambulatory Visit: Payer: Self-pay

## 2019-09-22 VITALS — BP 140/88 | HR 71 | Temp 97.3°F | Ht 63.0 in | Wt 184.0 lb

## 2019-09-22 DIAGNOSIS — R0609 Other forms of dyspnea: Secondary | ICD-10-CM | POA: Insufficient documentation

## 2019-09-22 DIAGNOSIS — R0602 Shortness of breath: Secondary | ICD-10-CM

## 2019-09-22 DIAGNOSIS — Z8249 Family history of ischemic heart disease and other diseases of the circulatory system: Secondary | ICD-10-CM

## 2019-09-22 MED ORDER — FUROSEMIDE 20 MG PO TABS: 20.0000 mg | ORAL_TABLET | Freq: Every day | ORAL | 3 refills | Status: DC

## 2019-09-22 NOTE — Progress Notes (Signed)
Patient referred by Glenda Chroman, MD for shortness of breath  Subjective:   Sandra Willis, female    DOB: 1955/07/19, 64 y.o.   MRN: [id number]   Chief Complaint  Patient presents with  . Shortness of Breath  . New Patient (Initial Visit)     HPI  64 y.o. for patient female with hypertension, type 2 diabetes mellitus, hyperlipidemia, hypothyroidism and shortness of breath.  Patient repots shortness of breath with minimal activity, such as walking to mailbox and back. She denies any chest pain. She recently had echocardiogram performed at PCP office, details below. She she reportedly has elevated cholesterol but previous intolerance to rosuvastatin atorvastatin.  Lipid panel results not available to me today.  She has family history of coronary artery disease with her father having had multiple bypass surgery since his early 58s.   Past Medical History:  Diagnosis Date  . Anxiety   . Depression   . Dermatitis    due to plants (poison ivy, sumac, oak)  . Diabetes ([place])   . Fatty liver disease, nonalcoholic    Pt is under care of specialist  . High blood pressure   . High cholesterol   . Low back pain   . Varicose veins of left lower extremity   . Vitamin D deficiency      Past Surgical History:  Procedure Laterality Date  . cataracts Bilateral   . DILATION AND CURETTAGE OF UTERUS    . ELBOW SURGERY    . hemeroidectomy       Social History   Tobacco Use  Smoking Status Never Smoker  Smokeless Tobacco Never Used    Social History   Substance and Sexual Activity  Alcohol Use No   Comment: quit 2000     Family History  Problem Relation Age of Onset  . Alcohol abuse Brother   . Personality disorder Sister   . Drug abuse Sister   . Diabetes Sister   . Alcohol abuse Father   . Heart disease Father   . High Cholesterol Father   . Hypertension Father   . Cancer Mother   . Diabetes Mother      Current Outpatient Medications on File Prior to Visit   Medication Sig Dispense Refill  . aspirin EC 81 MG tablet Take 81 mg by mouth daily.    Marland Kitchen atenolol-chlorthalidone (TENORETIC) 50-25 MG tablet Take 0.5 tablets by mouth daily.    . cetirizine (ZYRTEC) 10 MG tablet Take 10 mg by mouth daily.    . diclofenac (VOLTAREN) 75 MG EC tablet Take 75 mg by mouth daily.    . ergocalciferol (VITAMIN D2) 50000 units capsule Take 50,000 Units by mouth once a week.    . fenofibrate (TRICOR) 145 MG tablet Take 145 mg by mouth daily.    . Multiple Vitamin (MULTIVITAMIN) tablet Take 1 tablet by mouth daily.    Marland Kitchen PARoxetine (PAXIL) 20 MG tablet Take 1 tablet (20 mg total) by mouth daily. 30 tablet 3   No current facility-administered medications on file prior to visit.    Cardiovascular and other pertinent studies:   EKG 09/22/2019: Sinus rhythm 62 bpm. Anterolateral T wave inversion, consider ischemia.    Outside echocardiogram 08/28/2019: LVEF rhythm 75%.  Mild increase in the thickness.  Normal diastolic function. Mild tricuspid elicitation.  Estimated RVSP 20-30 mmHg.   Recent labs: 08/28/2019: Glucose 152, BUN/Cr 16/0.7. EGFR normal. Na/K 142/3.5. Rest of the CMP normal H/H 15/46. MCV 91. Platelets  310 HbA1C 6.7% TSH 2.2 normal   Review of Systems  Cardiovascular: Positive for dyspnea on exertion. Negative for chest pain, leg swelling, palpitations and syncope.  Respiratory: Positive for shortness of breath.          Vitals:   09/22/19 1030  BP: 140/88  Pulse: 71  Temp: (!) 97.3 F (36.3 C)  SpO2: 98%     Body mass index is 32.59 kg/m. Filed Weights   09/22/19 1030  Weight: 184 lb (83.5 kg)     Objective:   Physical Exam  Constitutional: She appears well-developed and well-nourished.  Neck: No JVD present.  Cardiovascular: Normal rate, regular rhythm, normal heart sounds and intact distal pulses.  No murmur heard. Pulmonary/Chest: Effort normal and breath sounds normal. She has no wheezes. She has no rales.    Musculoskeletal:        General: No edema.  Nursing note and vitals reviewed.        Assessment & Recommendations:   64 y.o. for patient female with hypertension, type 2 diabetes mellitus, hyperlipidemia, hypothyroidism and shortness of breath.  Exertional dyspnea: Abnormal EKG at baseline.  Physical exam unremarkable for heart failure.  Echocardiogram shows mild diastolic dysfunction.  Will start Lasix 20 mg daily. No suspicion for angina equivalent, will obtain nuclear stress test and calcium score scan.   Hyperlipidemia: Labs not available to me today. Reported history of statin intolerance.  May need to consider PCSK9 inhibitor in future.  Hypertension: Fairly well controlled.  Further recommendations after above testing.  Thank you for referring the patient to [place]. Please feel free to contact with any questions.  Nigel Mormon, MD [facility] Pager: [phone] Office: [phone]

## 2019-09-25 ENCOUNTER — Other Ambulatory Visit: Payer: Self-pay

## 2019-09-25 ENCOUNTER — Ambulatory Visit: Payer: BC Managed Care – PPO

## 2019-09-25 DIAGNOSIS — R0602 Shortness of breath: Secondary | ICD-10-CM

## 2019-09-25 NOTE — Progress Notes (Signed)
Manish, you saw her last and she has a f/u with you.  JG

## 2019-09-25 NOTE — Progress Notes (Signed)
Coronary calcium score 09/22/2019: Total calcium score 9.0.  Noted in the LAD. Normal heart size, ascending thoracic aorta measures 4.3 x 3.9 cm. Couple prominent left axillary nodes, largest measuring 1.2 x 1.1 cm. Visible abdomen and lung fields are clear.  Impression: Total calcium score of 9 is between 50th and 75th percentile for females between ages 13-64.  Compatible with minimal identifiable coronary plaque.  Risk for coronary artery disease very unlikely, less than 10%. Thoracic ascending aortic aneurysm measuring 4.3 x 3.9 cm. Minimally enlarged left axillary lymph node which is nonspecific but could be reactive.

## 2019-10-09 ENCOUNTER — Encounter: Payer: Self-pay | Admitting: Cardiology

## 2019-10-11 ENCOUNTER — Encounter: Payer: Self-pay | Admitting: Cardiology

## 2019-10-11 ENCOUNTER — Other Ambulatory Visit: Payer: Self-pay

## 2019-10-11 ENCOUNTER — Telehealth: Payer: BC Managed Care – PPO | Admitting: Cardiology

## 2019-10-11 VITALS — BP 122/80 | HR 76 | Ht 63.0 in | Wt 184.0 lb

## 2019-10-11 DIAGNOSIS — R931 Abnormal findings on diagnostic imaging of heart and coronary circulation: Secondary | ICD-10-CM | POA: Insufficient documentation

## 2019-10-11 DIAGNOSIS — I77810 Thoracic aortic ectasia: Secondary | ICD-10-CM

## 2019-10-11 DIAGNOSIS — R0609 Other forms of dyspnea: Secondary | ICD-10-CM

## 2019-10-11 NOTE — Progress Notes (Signed)
  Patient referred by Vyas, Dhruv B, MD for shortness of breath  Subjective:   Sandra Willis, female    DOB: 04/10/1956, 63 y.o.   MRN: [id number]   I connected with the patient on 10/11/2019 by a telephone call and verified that I am speaking with the correct person using two identifiers.     I offered the patient a video enabled application for a virtual visit. Unfortunately, this could not be accomplished due to technical difficulties/lack of video enabled phone/computer. I discussed the limitations of evaluation and management by telemedicine and the availability of in person appointments. The patient expressed understanding and agreed to proceed.   This visit type was conducted due to national recommendations for restrictions regarding the COVID-19 Pandemic (e.g. social distancing).  This format is felt to be most appropriate for this patient at this time.  All issues noted in this document were discussed and addressed.  No physical exam was performed (except for noted visual exam findings with Tele health visits).  The patient has consented to conduct a Tele health visit and understands insurance will be billed.   Chief Complaint  Patient presents with  . Shortness of Breath  . Follow-up  . Results    63 y.o. for patient female with hypertension, type 2 diabetes mellitus, hyperlipidemia, hypothyroidism and shortness of breath.  Cardiac workup showed minimally elevated calcium score, no ischemia on stress test. There was also incidental findings of left axillary lymph nodes and mildly dilated ascending aorta.   Her blood pressure has improved with lasix, but she continues to have exertional dyspnea.   Initial consultation HPI 09/22/2019:  Patient repots shortness of breath with minimal activity, such as walking to mailbox and back. She denies any chest pain. She recently had echocardiogram performed at PCP office, details below. She she reportedly has elevated cholesterol but previous  intolerance to rosuvastatin atorvastatin.  Lipid panel results not available to me today.  She has family history of coronary artery disease with her father having had multiple bypass surgery since his early 60s.     Current Outpatient Medications on File Prior to Visit  Medication Sig Dispense Refill  . aspirin EC 81 MG tablet Take 81 mg by mouth daily.    . atenolol-chlorthalidone (TENORETIC) 50-25 MG tablet Take 0.5 tablets by mouth daily.    . cetirizine (ZYRTEC) 10 MG tablet Take 10 mg by mouth daily.    . diclofenac (VOLTAREN) 75 MG EC tablet Take 75 mg by mouth daily.    . ergocalciferol (VITAMIN D2) 50000 units capsule Take 50,000 Units by mouth once a week.    . esomeprazole (NEXIUM) 40 MG capsule Take 40 mg by mouth daily.    . fenofibrate (TRICOR) 145 MG tablet Take 145 mg by mouth daily.    . furosemide (LASIX) 20 MG tablet Take 1 tablet (20 mg total) by mouth daily. 30 tablet 3  . ibuprofen (ADVIL) 800 MG tablet Take 800 mg by mouth 2 (two) times daily.    . Multiple Vitamin (MULTIVITAMIN) tablet Take 1 tablet by mouth daily.    . PARoxetine (PAXIL) 20 MG tablet Take 1 tablet (20 mg total) by mouth daily. 30 tablet 3   No current facility-administered medications on file prior to visit.    Cardiovascular and other pertinent studies:  CT cardiac scoring 09/25/2019: LM: 0 LAD: 9 (50th-75th percentile) LCx: 0 RCA: 0  The ascending thoracic aorta measures 4.3 x 3.9 cm. Minimally enlarged left axillary lymph   node which is nonspecific but could be reactive.   Lexiscan (Walking with mod Bruce) Sestamibi Stress Test 09/25/2019: Nondiagnostic ECG stress. Very mild breast attenuation noted in the inferior wall. Myocardial perfusion is normal. Stress LV EF: 62%.  No previous exam available for comparison. Low risk.   EKG 09/22/2019: Sinus rhythm 62 bpm. Anterolateral T wave inversion, consider ischemia.    Outside echocardiogram 08/28/2019: LVEF rhythm 75%.  Mild increase  in the thickness.  Normal diastolic function. Mild tricuspid regurgitaion.  Estimated RVSP 20-30 mmHg.   Recent labs: 08/28/2019: Glucose 152, BUN/Cr 16/0.7. EGFR normal. Na/K 142/3.5. Rest of the CMP normal H/H 15/46. MCV 91. Platelets 310 HbA1C 6.7% TSH 2.2 normal Chol ?232    Review of Systems  Cardiovascular: Positive for dyspnea on exertion. Negative for chest pain, leg swelling, palpitations and syncope.  Respiratory: Positive for shortness of breath.          Vitals:   10/11/19 0822  BP: 122/80  Pulse: 76     Body mass index is 32.59 kg/m. Filed Weights   10/11/19 0822  Weight: 184 lb (83.5 kg)     Objective:   Physical Exam  Not performed. Telephone visit.        Assessment & Recommendations:   63 y.o. for patient female with hypertension, type 2 diabetes mellitus, hyperlipidemia, hypothyroidism and shortness of breath.  Exertional dyspnea: Physical exam unremarkable for heart failure.  Echocardiogram shows no signifciant abnormalities. No improvement with lasix. Stress test shows no ischemia. Consider alternate etiology for shortness of breath.  Dilated ascending aorta: Seen on non-contrast study. Recommend contrast CTA in 6 months.  Elevated calcium score: Minimally elevated score at 9 in LAD. She has been intolerant to statins in the past. I do not have her baseline lipid panel. Defer follow up to Dr. Vyas. Could consider alternate statins/ PCSK9 inhibitor/bempedoic acid.  Axillary lymphadenopathy: Consider further workup, ?biopsy  F/u in 6 months.  Manish J Patwardhan, MD [facility] Pager: [phone] Office: [phone] 

## 2019-11-10 ENCOUNTER — Other Ambulatory Visit (HOSPITAL_COMMUNITY): Payer: Self-pay | Admitting: Respiratory Therapy

## 2019-11-10 DIAGNOSIS — R0602 Shortness of breath: Secondary | ICD-10-CM

## 2019-12-01 ENCOUNTER — Other Ambulatory Visit (HOSPITAL_COMMUNITY)
Admission: RE | Admit: 2019-12-01 | Discharge: 2019-12-01 | Disposition: A | Discharge status: Home / Self Care | Payer: BC Managed Care – PPO | Source: Ambulatory Visit | Attending: Internal Medicine | Admitting: Internal Medicine

## 2019-12-01 ENCOUNTER — Other Ambulatory Visit: Payer: Self-pay

## 2019-12-01 DIAGNOSIS — Z20822 Contact with and (suspected) exposure to covid-19: Secondary | ICD-10-CM | POA: Diagnosis not present

## 2019-12-01 DIAGNOSIS — Z01812 Encounter for preprocedural laboratory examination: Secondary | ICD-10-CM | POA: Insufficient documentation

## 2019-12-02 LAB — SARS CORONAVIRUS 2 (TAT 6-24 HRS): SARS Coronavirus 2: NEGATIVE

## 2019-12-05 ENCOUNTER — Other Ambulatory Visit: Payer: Self-pay

## 2019-12-05 ENCOUNTER — Ambulatory Visit (HOSPITAL_COMMUNITY)
Admission: RE | Admit: 2019-12-05 | Discharge: 2019-12-05 | Disposition: A | Discharge status: Home / Self Care | Payer: BC Managed Care – PPO | Source: Ambulatory Visit | Attending: Internal Medicine | Admitting: Internal Medicine

## 2019-12-05 DIAGNOSIS — R0602 Shortness of breath: Secondary | ICD-10-CM | POA: Diagnosis not present

## 2019-12-05 LAB — PULMONARY FUNCTION TEST
DL/VA % pred: 121 %
DL/VA: 5.15 ml/min/mmHg/L
DLCO unc % pred: 98 %
DLCO unc: 19.12 ml/min/mmHg
FEF 25-75 Post: 3.38 L/sec
FEF 25-75 Pre: 3.21 L/sec
FEF2575-%Change-Post: 5 %
FEF2575-%Pred-Post: 153 %
FEF2575-%Pred-Pre: 145 %
FEV1-%Change-Post: 2 %
FEV1-%Pred-Post: 97 %
FEV1-%Pred-Pre: 95 %
FEV1-Post: 2.35 L
FEV1-Pre: 2.3 L
FEV1FVC-%Change-Post: 1 %
FEV1FVC-%Pred-Pre: 111 %
FEV6-%Change-Post: 2 %
FEV6-%Pred-Post: 89 %
FEV6-%Pred-Pre: 87 %
FEV6-Post: 2.7 L
FEV6-Pre: 2.63 L
FEV6FVC-%Pred-Post: 103 %
FEV6FVC-%Pred-Pre: 103 %
FVC-%Change-Post: 1 %
FVC-%Pred-Post: 86 %
FVC-%Pred-Pre: 84 %
FVC-Post: 2.7 L
FVC-Pre: 2.66 L
Post FEV1/FVC ratio: 87 %
Post FEV6/FVC ratio: 100 %
Pre FEV1/FVC ratio: 86 %
Pre FEV6/FVC Ratio: 100 %
RV % pred: 92 %
RV: 1.84 L
TLC % pred: 95 %
TLC: 4.69 L

## 2019-12-05 MED ORDER — ALBUTEROL SULFATE (2.5 MG/3ML) 0.083% IN NEBU
2.5000 mg | INHALATION_SOLUTION | Freq: Once | RESPIRATORY_TRACT | Status: AC
  Administered 2019-12-05: 2.5 mg via RESPIRATORY_TRACT

## 2020-02-07 ENCOUNTER — Other Ambulatory Visit: Payer: Self-pay | Admitting: Cardiology

## 2020-02-07 DIAGNOSIS — R0602 Shortness of breath: Secondary | ICD-10-CM

## 2020-04-20 NOTE — Progress Notes (Signed)
No show

## 2020-04-26 ENCOUNTER — Ambulatory Visit: Payer: BC Managed Care – PPO | Admitting: Cardiology

## 2020-04-26 DIAGNOSIS — I77810 Thoracic aortic ectasia: Secondary | ICD-10-CM

## 2020-04-26 DIAGNOSIS — R931 Abnormal findings on diagnostic imaging of heart and coronary circulation: Secondary | ICD-10-CM

## 2020-11-04 ENCOUNTER — Ambulatory Visit
Admission: RE | Admit: 2020-11-04 | Discharge: 2020-11-04 | Disposition: A | Discharge status: Home / Self Care | Payer: BC Managed Care – PPO | Source: Ambulatory Visit | Attending: Cardiology | Admitting: Cardiology

## 2020-11-04 ENCOUNTER — Other Ambulatory Visit: Payer: Self-pay

## 2020-11-04 DIAGNOSIS — I77810 Thoracic aortic ectasia: Secondary | ICD-10-CM

## 2020-11-04 IMAGING — CT CT ANGIO CHEST
1 series · 18 of 32 positions shown · IV contrast (APPLIED)
Comparison: No chest comparison available. CT abdomen/pelvis
[DATE]

CLINICAL DATA: 64-year-old female with a history of dilated
ascending aorta

EXAM:
CT ANGIOGRAPHY CHEST WITH CONTRAST
TECHNIQUE: Multidetector CT imaging of the chest was performed using the
standard protocol during bolus administration of intravenous
contrast. Multiplanar CT image reconstructions and MIPs were
obtained to evaluate the vascular anatomy.
CONTRAST:  75mL [89] IOPAMIDOL ([89]) INJECTION 76%

[Series 4: chest angio · axial · 0.85mm/px · z∈[-330,-42]mm · 18 of 104 slices shown]
[im 4/104  lung]
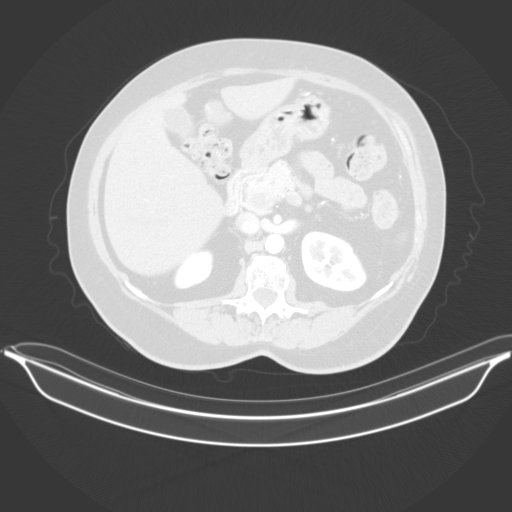
[im 10/104  soft-tissue]
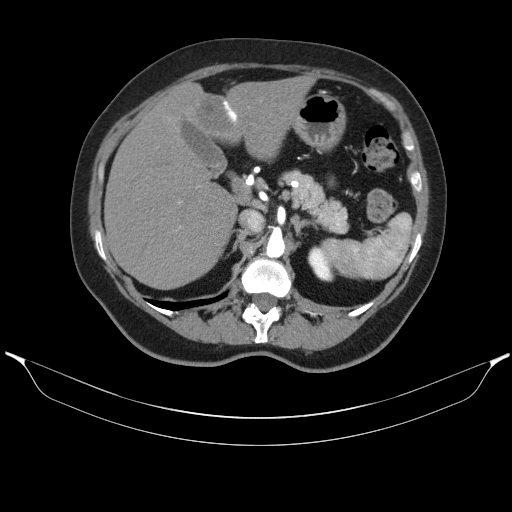
[im 17/104  lung]
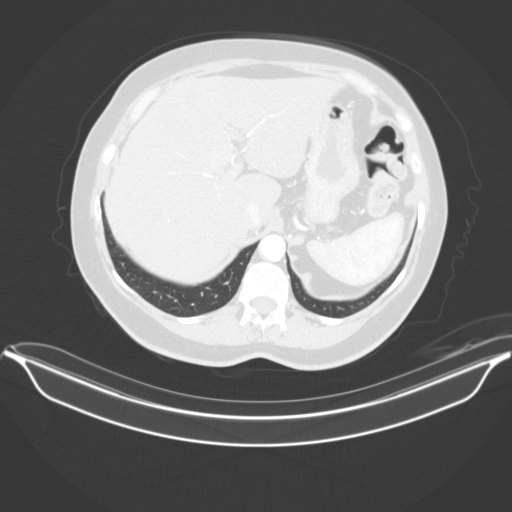
[im 20/104  soft-tissue]
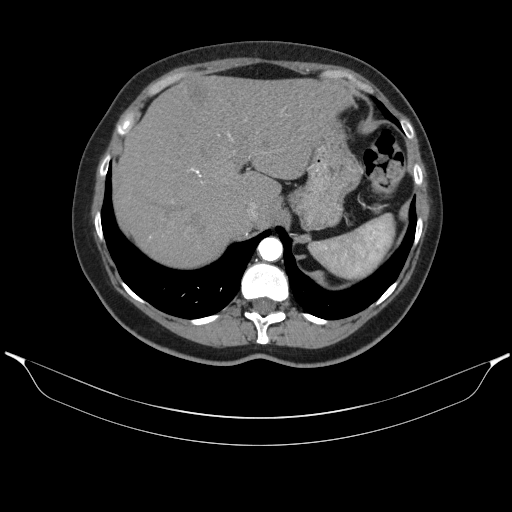
[im 27/104  lung]
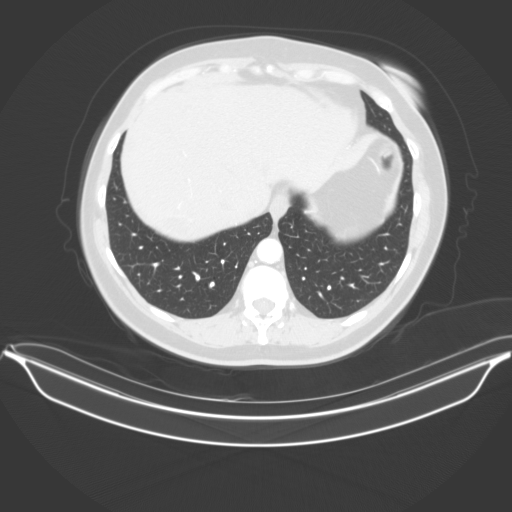
[im 34/104  soft-tissue]
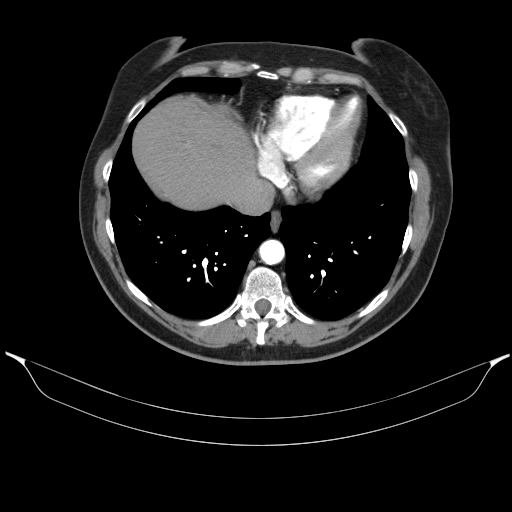
[im 37/104  lung]
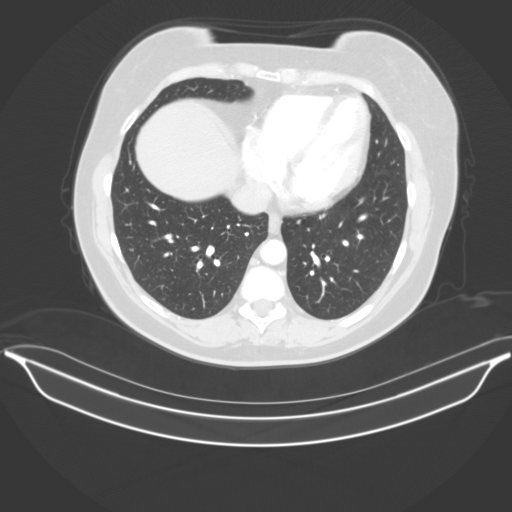
[im 44/104  soft-tissue]
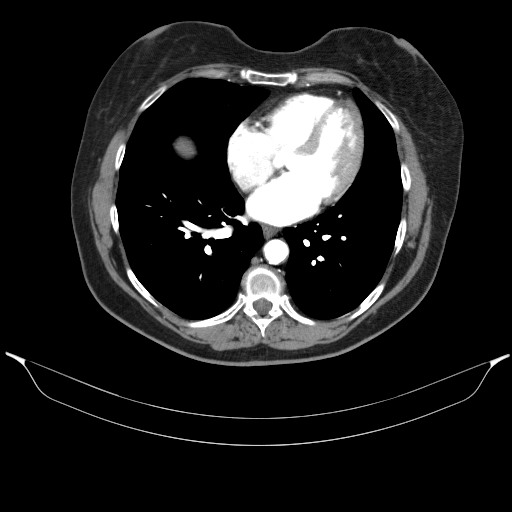
[im 50/104  lung]
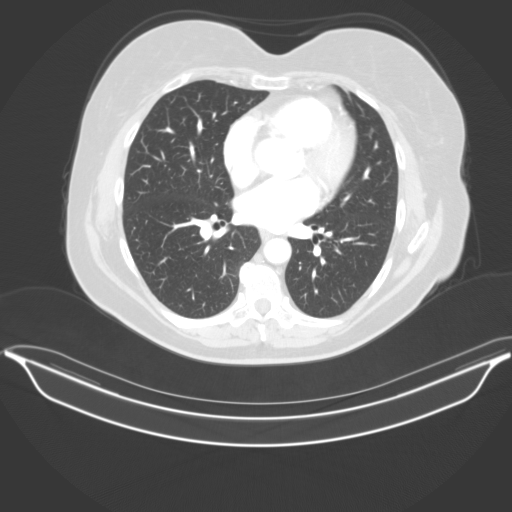
[im 54/104  soft-tissue]
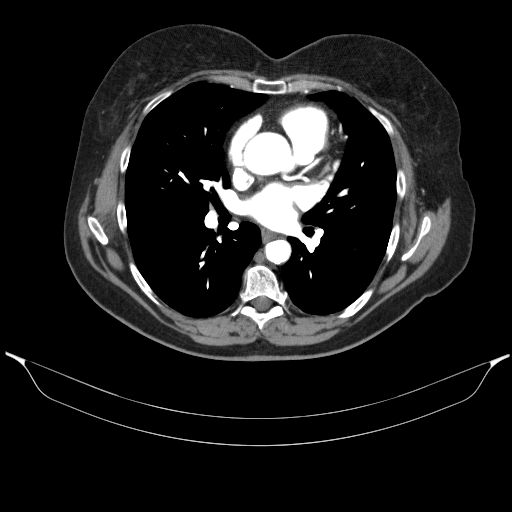
[im 60/104  lung]
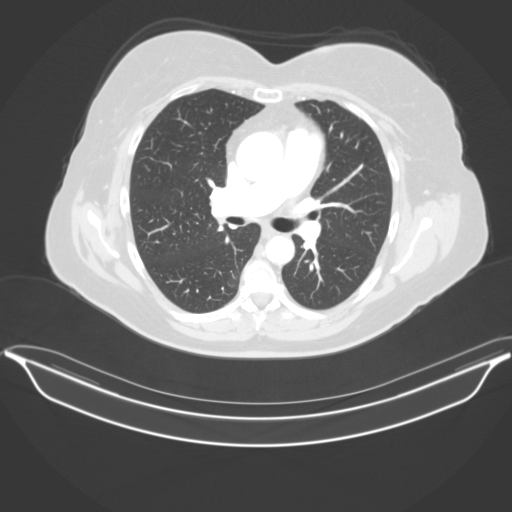
[im 67/104  soft-tissue]
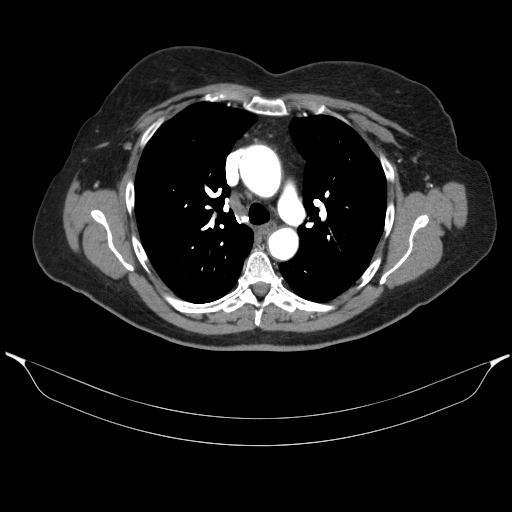
[im 70/104  lung]
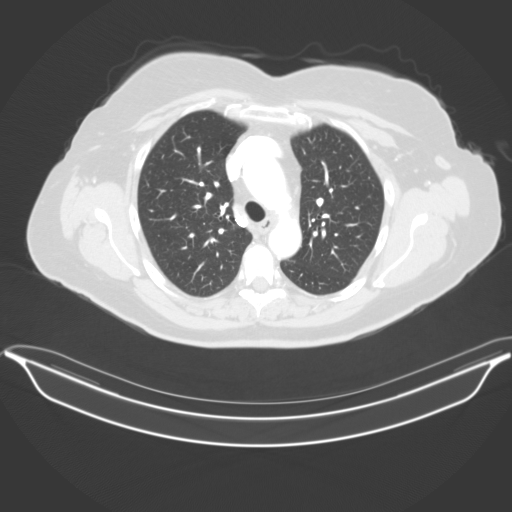
[im 77/104  soft-tissue]
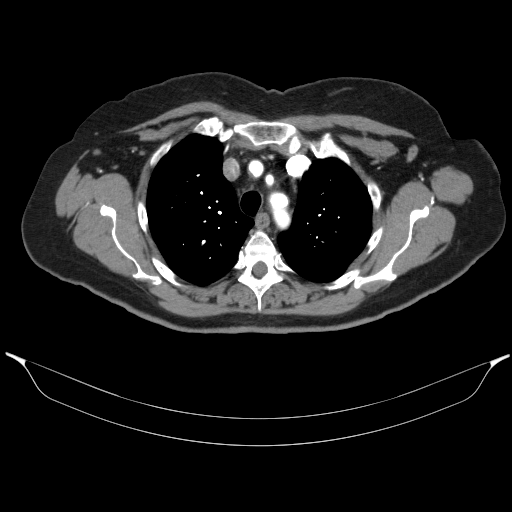
[im 84/104  lung]
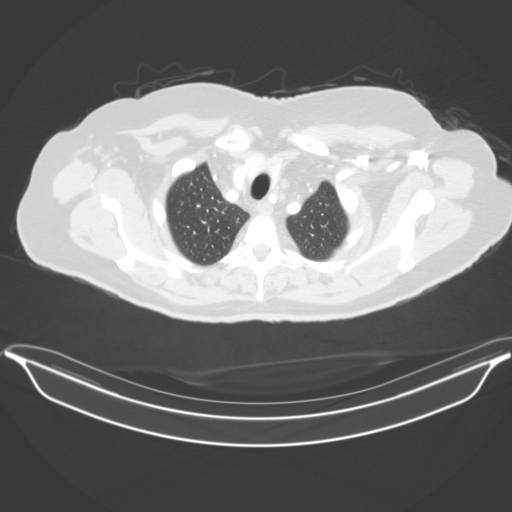
[im 87/104  soft-tissue]
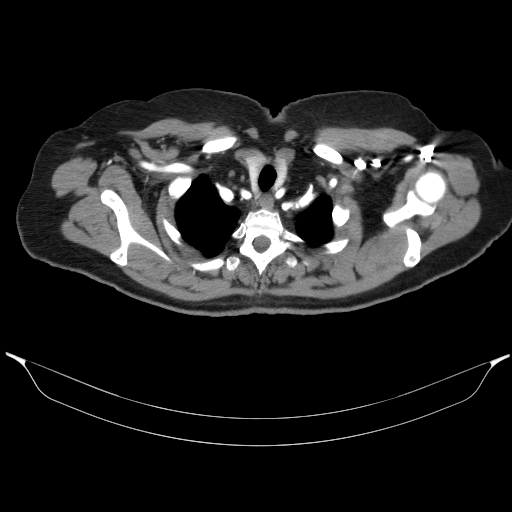
[im 94/104  lung]
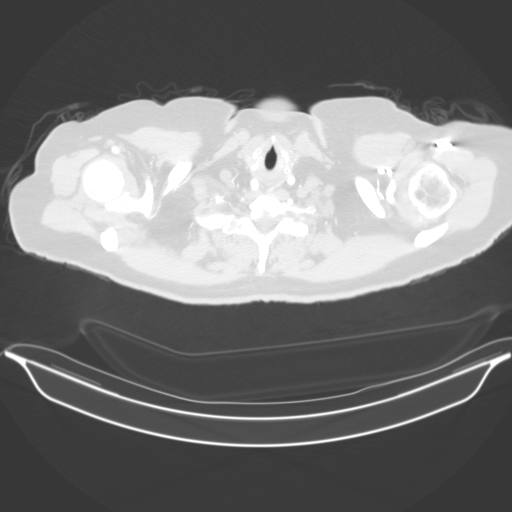
[im 100/104  soft-tissue]
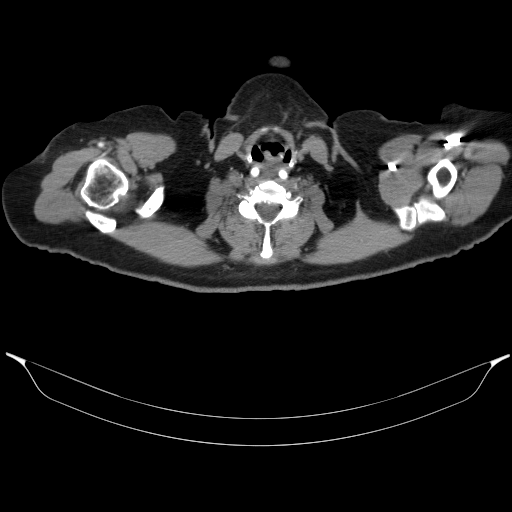

[18 of 32 positions shown; findings below may reference images not displayed]

FINDINGS: Cardiovascular:

Heart:

No cardiomegaly. No pericardial fluid/thickening. No significant
coronary calcifications.

Aorta:

No significant aortic valve calcifications. Greatest estimated
diameter of the ascending aorta on the axial images on this non
gated study, 38 mm. Atherosclerotic changes of the aortic arch.
Three vessel arch with patency of the branch vessels. Cervical
cerebral vessels patent at the base of the neck. Mild
atherosclerosis at the origin of the left vertebral artery with
perhaps 50% narrowing.

No dissection. No periaortic fluid. Mild atherosclerosis of the
distal thoracic aorta. No pedunculated plaque or ulcerated plaque.

Pulmonary arteries:

Timing of the contrast bolus is not optimized for evaluation of
pulmonary artery filling defects. No filling defect of the left
atrium

Mediastinum/Nodes: Small lymph nodes of the mediastinum.
Unremarkable thoracic inlet.

Unremarkable course of the thoracic esophagus.

Lungs/Pleura: Central airways are clear. No pleural effusion. No
confluent airspace disease.

No pneumothorax.

Upper Abdomen: No acute finding of the upper abdomen.

Redemonstration of low-density lesions within the liver in segment 8
(2.2 cm) segment 7 (4.0 cm).

There is a new exophytic mass from the caudal aspect of segment 4 B
measuring as large as 4 cm on the axial images. On this arterial
phase imaging there is peripheral enhancement, and the lesion is
incompletely characterized.

Musculoskeletal: No acute displaced fracture. Degenerative changes
of the spine.

Review of the MIP images confirms the above findings.
IMPRESSION: No acute finding.

Ectasia of the ascending aorta, with a maximum diameter 3.8 cm.

There is a new liver lesion incidentally imaged, at the caudal
aspect of segment 4 B. this lesion is incompletely characterized,
and is atypical configuration for a benign hemangioma given the
exophytic nature. Referral for liver protocol are MRI is
recommended, as hepatocellular carcinoma cannot be excluded.

Similar appearance of liver hemangiomas, relatively unchanged dating
to the CT [DATE].

Aortic Atherosclerosis ([89]-[89]).

## 2020-11-04 MED ORDER — IOPAMIDOL (ISOVUE-370) INJECTION 76%
75.0000 mL | Freq: Once | INTRAVENOUS | Status: AC | PRN
  Administered 2020-11-04: 75 mL via INTRAVENOUS

## 2020-11-14 ENCOUNTER — Telehealth: Payer: Self-pay | Admitting: Cardiology

## 2020-11-14 DIAGNOSIS — K769 Liver disease, unspecified: Secondary | ICD-10-CM | POA: Insufficient documentation

## 2020-11-14 NOTE — Telephone Encounter (Signed)
CTA aorta 11/04/2020: No acute finding. Ectasia of the ascending aorta, with a maximum diameter 3.8 cm. There is a new liver lesion incidentally imaged, at the caudal aspect of segment 4 B. this lesion is incompletely characterized, and is atypical configuration for a benign hemangioma given the exophytic nature. Referral for liver protocol are MRI is recommended, as hepatocellular carcinoma cannot be excluded.  Similar appearance of liver hemangiomas, relatively unchanged dating to the CT 12/27/2003.  Aortic Atherosclerosis (ICD10-I70.0).  Reviewed the above finding with the patient. Aorta is upper limit normal. I do not believe she needs f/u for aorta.   There is a new liver lesion seen on CT. She may need more liver specific imaging for this. I will forward the results to Dr. Woody Seller and defer further workup to him. I have encouraged the patient to check with Dr. Woody Seller [id number] steps.    Nigel Mormon, MD Pager: [phone] Office: [phone]

## 2020-11-15 ENCOUNTER — Telehealth: Payer: Self-pay

## 2020-12-09 ENCOUNTER — Other Ambulatory Visit: Payer: Self-pay | Admitting: Internal Medicine

## 2020-12-09 DIAGNOSIS — R160 Hepatomegaly, not elsewhere classified: Secondary | ICD-10-CM

## 2020-12-09 NOTE — Telephone Encounter (Signed)
Error

## 2020-12-14 ENCOUNTER — Other Ambulatory Visit: Payer: Self-pay

## 2020-12-14 ENCOUNTER — Ambulatory Visit
Admission: RE | Admit: 2020-12-14 | Discharge: 2020-12-14 | Disposition: A | Discharge status: Home / Self Care | Payer: BC Managed Care – PPO | Source: Ambulatory Visit | Attending: Internal Medicine | Admitting: Internal Medicine

## 2020-12-14 DIAGNOSIS — R160 Hepatomegaly, not elsewhere classified: Secondary | ICD-10-CM

## 2020-12-14 IMAGING — MR MR ABDOMEN WO/W CM
10 of 17 series · 21 of 48 positions shown · IV contrast (15ml Multihance)
Comparison: CT chest [DATE]

CLINICAL DATA: Liver mass on CT scan.

EXAM:
MRI ABDOMEN WITHOUT AND WITH CONTRAST
TECHNIQUE: Multiplanar multisequence MR imaging of the abdomen was performed
both before and after the administration of intravenous contrast.
CONTRAST:  15mL MAGNEVIST GADOPENTETATE DIMEGLUMINE 469.01 MG/ML IV
SOLN

[Series 3: cor haste · coronal · 5.0mm · 0.70mm/px · 1 of 38 slices shown]
[im 1/38]
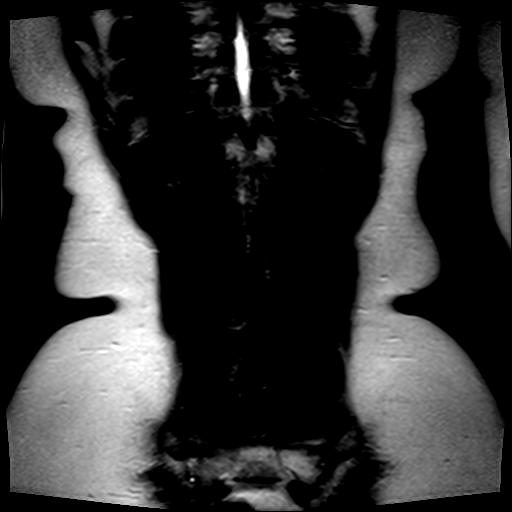

[Series 4: axial haste · axial · 6.0mm · 0.78mm/px · 1 of 43 slices shown]
[im 1/43]
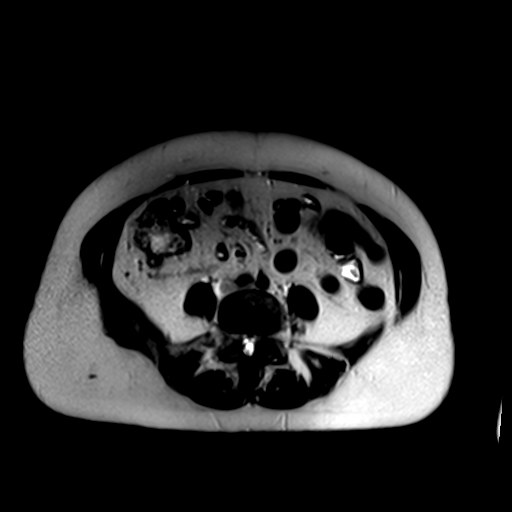

[Series 5: T1 · axial · 6.0mm · 0.78mm/px · z∈[-140,+136]mm · 2 of 86 slices shown]
[im 1/86]
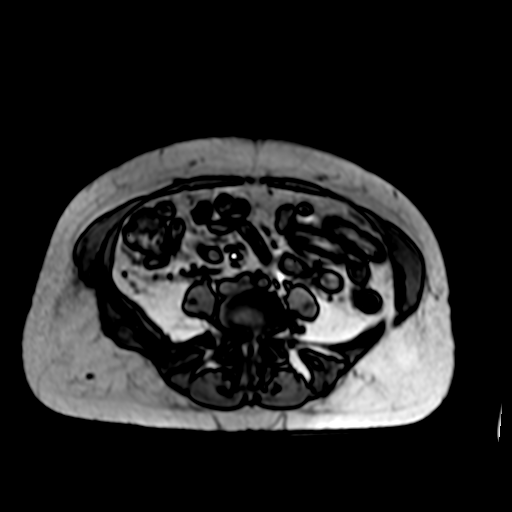
[im 86/86]
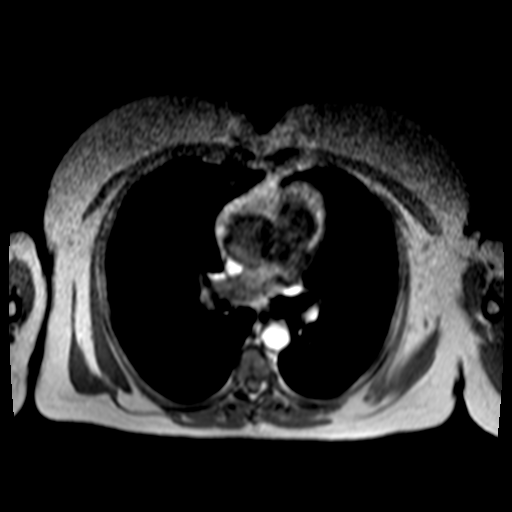

[Series 6: bSSFP · axial · 4.0mm · 0.78mm/px · 1 of 68 slices shown]
[im 1/68]
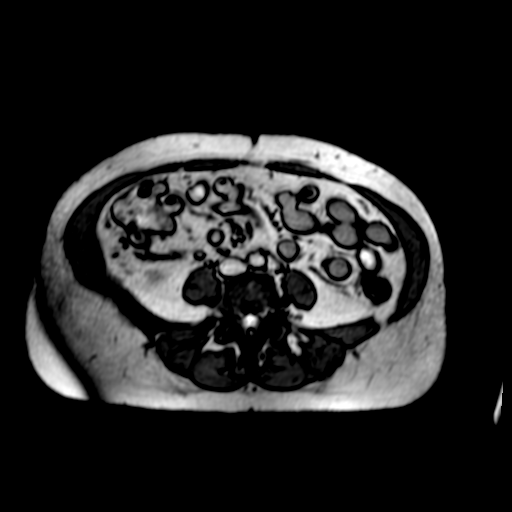

[Series 7: T2 fat-sat · axial · 6.0mm · 1.25mm/px · 1 of 43 slices shown]
[im 1/43]
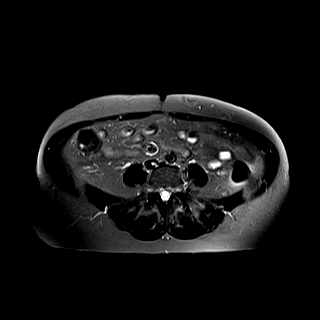

[Series 8: ep2d_diff_b50_500_800_p2_trig · axial · 6.0mm · 2.08mm/px · z∈[-64,+160]mm · 2 of 96 slices shown]
[im 1/96]
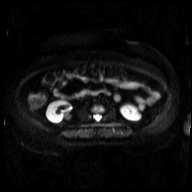
[im 96/96]
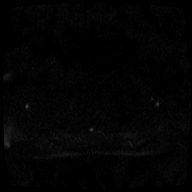

[Series 9: [id number]_800_p2_trig_adc · axial · 6.0mm · 2.08mm/px · 1 of 32 slices shown]
[im 1/32]
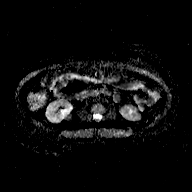

[Series 10: T1 dynamic · axial · non-contrast · 2.5mm · 0.78mm/px · z∈[-138,+139]mm · 4 of 112 slices shown]
[im 1/112]
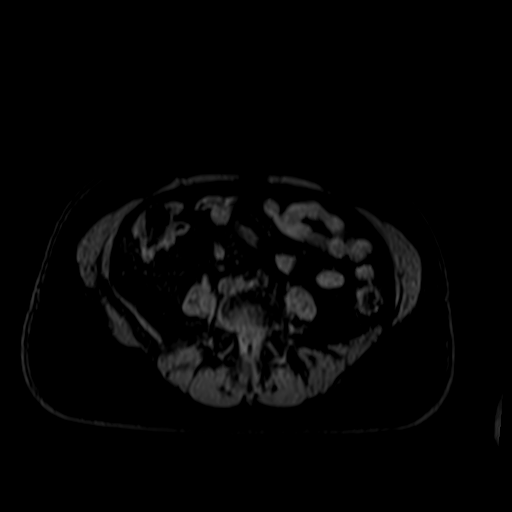
[im 38/112]
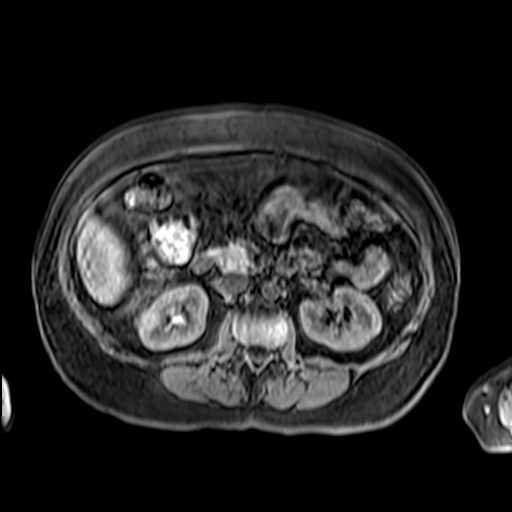
[im 75/112]
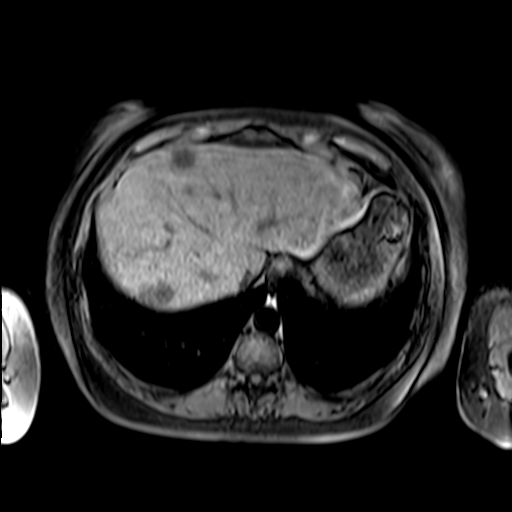
[im 112/112]
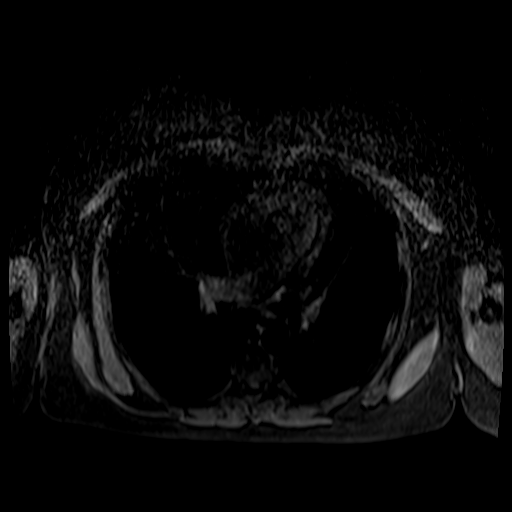

[Series 11: T1 dynamic post-contrast · axial · 2.5mm · 0.78mm/px · z∈[-138,+139]mm · 4 of 112 slices shown (1 of 2)]
[im 1/112]
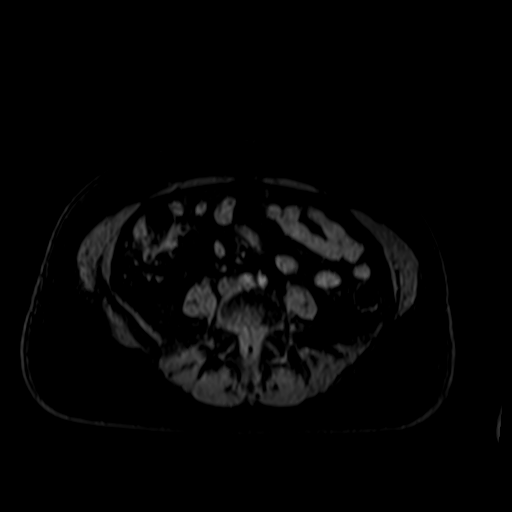
[im 38/112]
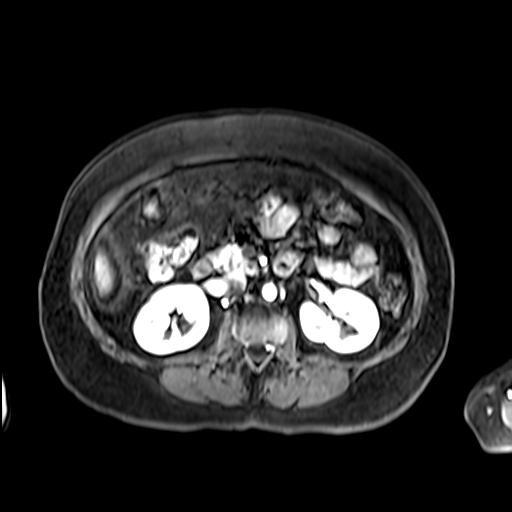
[im 75/112]
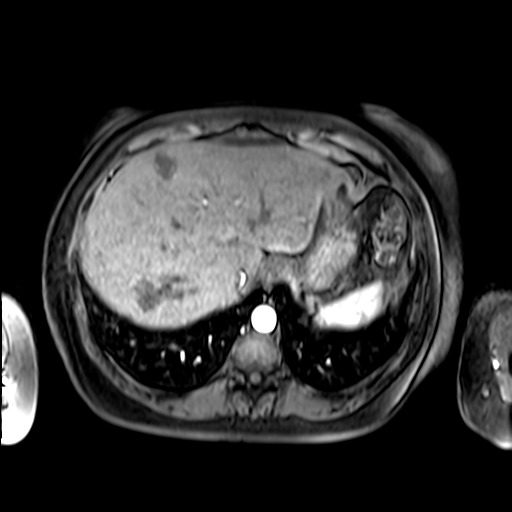
[im 112/112]
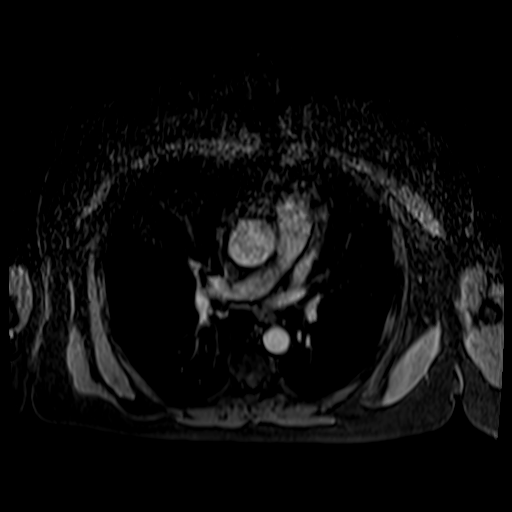

[Series 12: T1 dynamic post-contrast · axial · 2.5mm · 0.78mm/px · z∈[-138,+139]mm · 4 of 112 slices shown (2 of 2)]
[im 1/112]
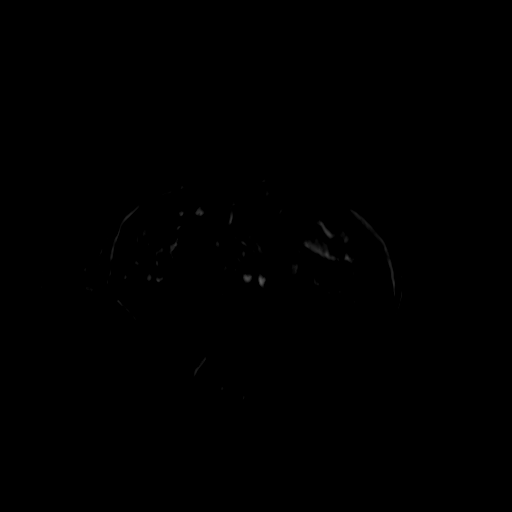
[im 38/112]
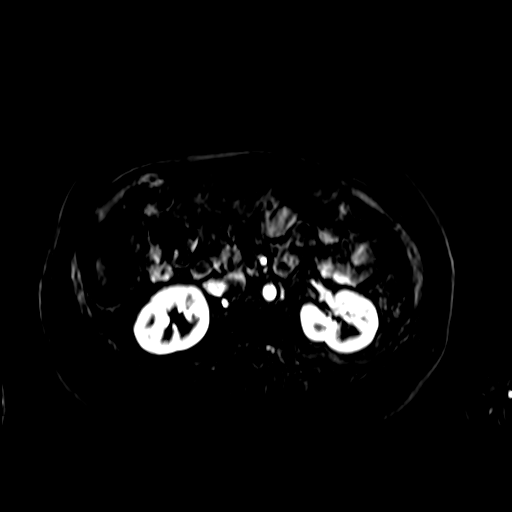
[im 75/112]
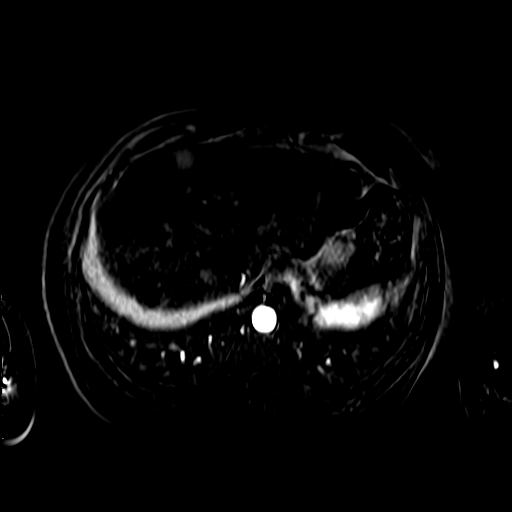
[im 112/112]
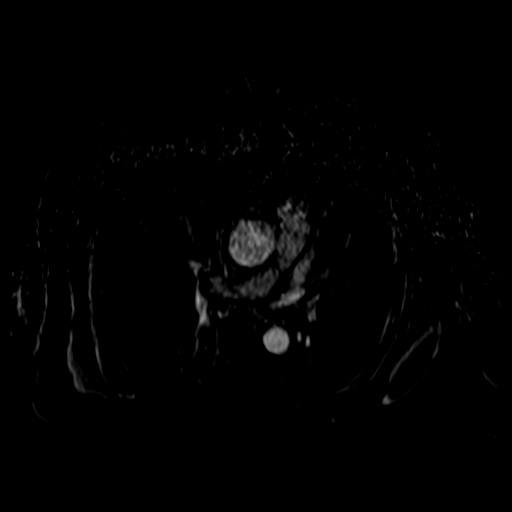

[21 of 48 positions shown; findings below may reference images not displayed]

FINDINGS: Lower chest: Unremarkable.

Hepatobiliary: 2.3 cm cavernous hemangioma identified anterior left
liver ([DATE]). 2.8 cm cavernous hemangioma identified in the
posterior right hepatic dome on [DATE]. Lesion of concern on previous
CT involving segment IV B is also a cavernous hemangioma, a liver
lesion that can be exophytic at times.14 mm T2 hyperintense lesion
in the posterior right liver shows peripheral nodular enhancement
consistent with hemangioma.

10 mm hypervascular lesion in the dome of the liver (23/13) cannot
be definitively characterized but may well be a flash filling
hemangioma.

Diffuse fatty deposition noted in the liver parenchyma on out of
phase T1 imaging. There is no evidence for gallstones, gallbladder
wall thickening, or pericholecystic fluid. No intrahepatic or
extrahepatic biliary dilation.

Pancreas:  Pancreas divisum anatomy noted.  Otherwise unremarkable.

Spleen:  No splenomegaly. No focal mass lesion.

Adrenals/Urinary Tract: No adrenal nodule or mass. 17 mm right renal
cyst. Tiny T2 hyperintensities in both kidneys are too small to
characterize but likely benign.

Stomach/Bowel: Stomach is unremarkable. No gastric wall thickening.
No evidence of outlet obstruction. Duodenum is normally positioned
as is the ligament of Treitz. No small bowel or colonic dilatation
within the visualized abdomen.

Vascular/Lymphatic: No abdominal aortic aneurysm. No abdominal
lymphadenopathy.

Other:  No intraperitoneal free fluid.

Musculoskeletal: No worrisome lytic or sclerotic osseous
abnormality.
IMPRESSION: Lesion of concern on previous CTA chest represents a cavernous
hemangioma in segment IV B of the liver.

Additional cavernous hemangiomas noted within the liver parenchyma.

10 mm hypervascular lesion in the dome of the liver cannot be
definitively characterized but is likely benign and also probably a
cavernous hemangioma. Follow-up MRI in 6 months could be used to
ensure stability.

## 2020-12-14 MED ORDER — GADOPENTETATE DIMEGLUMINE 469.01 MG/ML IV SOLN
15.0000 mL | Freq: Once | INTRAVENOUS | Status: AC | PRN
  Administered 2020-12-14: 15 mL via INTRAVENOUS

## 2021-03-14 ENCOUNTER — Other Ambulatory Visit: Payer: Self-pay | Admitting: Cardiology

## 2021-03-14 DIAGNOSIS — R0602 Shortness of breath: Secondary | ICD-10-CM

## 2021-08-21 ENCOUNTER — Ambulatory Visit (INDEPENDENT_AMBULATORY_CARE_PROVIDER_SITE_OTHER): Payer: BC Managed Care – PPO | Admitting: Internal Medicine

## 2021-08-21 ENCOUNTER — Encounter: Payer: Self-pay | Admitting: Internal Medicine

## 2021-08-21 ENCOUNTER — Other Ambulatory Visit (INDEPENDENT_AMBULATORY_CARE_PROVIDER_SITE_OTHER): Payer: BC Managed Care – PPO

## 2021-08-21 VITALS — BP 120/78 | HR 75 | Ht 64.0 in | Wt 168.0 lb

## 2021-08-21 DIAGNOSIS — R7989 Other specified abnormal findings of blood chemistry: Secondary | ICD-10-CM

## 2021-08-21 DIAGNOSIS — K625 Hemorrhage of anus and rectum: Secondary | ICD-10-CM | POA: Diagnosis not present

## 2021-08-21 DIAGNOSIS — D1800 Hemangioma unspecified site: Secondary | ICD-10-CM

## 2021-08-21 LAB — IBC + FERRITIN
Ferritin: 556.1 ng/mL — ABNORMAL HIGH (ref 10.0–291.0)
Iron: 88 ug/dL (ref 42–145)
Saturation Ratios: 23 % (ref 20.0–50.0)
TIBC: 382.2 ug/dL (ref 250.0–450.0)
Transferrin: 273 mg/dL (ref 212.0–360.0)

## 2021-08-21 LAB — HEPATIC FUNCTION PANEL
ALT: 44 U/L — ABNORMAL HIGH (ref 0–35)
AST: 39 U/L — ABNORMAL HIGH (ref 0–37)
Albumin: 4.7 g/dL (ref 3.5–5.2)
Alkaline Phosphatase: 76 U/L (ref 39–117)
Bilirubin, Direct: 0 mg/dL (ref 0.0–0.3)
Total Bilirubin: 0.5 mg/dL (ref 0.2–1.2)
Total Protein: 7.4 g/dL (ref 6.0–8.3)

## 2021-08-21 LAB — PROTIME-INR
INR: 0.9 ratio (ref 0.8–1.0)
Prothrombin Time: 9.8 s (ref 9.6–13.1)

## 2021-08-21 MED ORDER — PLENVU 140 G PO SOLR: 1.0000 | Freq: Once | ORAL | 0 refills | Status: AC

## 2021-08-21 NOTE — Patient Instructions (Addendum)
Your provider has requested that you go to the basement level for lab work before leaving today. Press "B" on the elevator. The lab is located at the first door on the left as you exit the elevator.  You have been scheduled for a colonoscopy. Please follow written instructions given to you at your visit today.  Please pick up your prep supplies at the pharmacy within the next 1-3 days. If you use inhalers (even only as needed), please bring them with you on the day of your procedure.  You have been scheduled for an MRI of the liver and ultrasound with elasto graphy at [facility] on 09/02/21. Your appointment time is 9:00 am. Please arrive to admitting (at main entrance of the hospital) 30 minutes prior to your appointment time for registration purposes. Please make certain not to have anything to eat or drink after midnight. In addition, if you have any metal in your body, have a pacemaker or defibrillator, please be sure to let your ordering physician know. This test typically takes 45 minutes to 1 hour to complete. Should you need to reschedule, please call [phone] to do so.  The [facility] providers would like to encourage you to use [facility] to communicate with providers for non-urgent requests or questions.  Due to long hold times on the telephone, sending your provider a message by [facility] may be a faster and more efficient way to get a response.  Please allow 48 business hours for a response.  Please remember that this is for non-urgent requests.   Due to recent changes in healthcare laws, you may see the results of your imaging and laboratory studies on MyChart before your provider has had a chance to review them.  We understand that in some cases there may be results that are confusing or concerning to you. Not all laboratory results come back in the same time frame and the provider may be waiting for multiple results in order to interpret others.  Please give [place] 48 hours in order  for your provider to thoroughly review all the results before contacting the office for clarification of your results.

## 2021-08-21 NOTE — Progress Notes (Signed)
Chief Complaint: Elevated LFTs  HPI : 66 year old female with history of DM, HLD, anxiety, depression, asthma, IBS presents with elevated LFTs  She was diagnosed with fatty liver about 15 years ago. Her LFTs go up and down.  She was previously on a statin but was taken off of this due to concerns about it contributing to her elevated LFTs.  She has been trying to lose weight, and has lost about 16 lbs over the last 1-2 years. Denies swelling in her abdomen.  She does take Lasix on occasion for swelling in her ankles.  Last colonoscopy was 10 years with a benign polyp. Has occasional rectal bleeding due to hemorrhoids. She has IBS with alternating constipation and diarrhea for the last 7 months. Has occasion ab pain. Denies confusion. Denies chest burning, regurgitation, dysphagia, N&V.  She denies any alcohol use.  Denies family history of liver disease.  Wt Readings from Last 3 Encounters:  08/21/21 168 lb (76.2 kg)  10/11/19 184 lb (83.5 kg)  09/22/19 184 lb (83.5 kg)    Past Medical History:  Diagnosis Date   Anxiety    Aortic atherosclerosis (HCC)    Asthma    Depression    Dermatitis    due to plants (poison ivy, sumac, oak)   Diabetes ([place])    Fatty liver disease, nonalcoholic    Pt is under care of specialist   High blood pressure    High cholesterol    IBS (irritable bowel syndrome)    Low back pain    Plantar fasciitis    Varicose veins of left lower extremity    Vitamin D deficiency      Past Surgical History:  Procedure Laterality Date   CARPAL TUNNEL RELEASE  2020   cataracts Bilateral    DILATION AND CURETTAGE OF UTERUS     ELBOW SURGERY     hemeroidectomy     Family History  Problem Relation Age of Onset   Alcohol abuse Brother    Personality disorder Sister    Drug abuse Sister    Diabetes Sister    Alcohol abuse Father    Heart disease Father    High Cholesterol Father    Hypertension Father    Cancer Mother    Diabetes Mother    Social  History   Tobacco Use   Smoking status: Never   Smokeless tobacco: Never  Vaping Use   Vaping Use: Never used  Substance Use Topics   Alcohol use: No    Comment: quit 2000   Drug use: No   Current Outpatient Medications  Medication Sig Dispense Refill   atenolol-chlorthalidone (TENORETIC) 50-25 MG tablet Take 0.5 tablets by mouth daily.     ergocalciferol (VITAMIN D2) 50000 units capsule Take 50,000 Units by mouth once a week.     furosemide (LASIX) 20 MG tablet TAKE 1 TABLET BY MOUTH EVERY DAY 90 tablet 1   metFORMIN (GLUCOPHAGE) 500 MG tablet Take 500 mg by mouth 2 (two) times daily.     Multiple Vitamin (MULTIVITAMIN) tablet Take 1 tablet by mouth daily.     PARoxetine (PAXIL) 20 MG tablet Take 1 tablet (20 mg total) by mouth daily. 30 tablet 3   No current facility-administered medications for this visit.   Allergies  Allergen Reactions   Fenofibrate     Aching   Statins Other (See Comments)    Diffuse pain, Simvastatin   Bactrim [Sulfamethoxazole-Trimethoprim] Rash   Sulfa Antibiotics Rash  bactrim   Zithromax [Azithromycin] Rash     Review of Systems: All systems reviewed and negative except where noted in HPI.   Physical Exam: BP 120/78    Pulse 75    Ht 5\' 4"  (1.626 m)    Wt 168 lb (76.2 kg)    BMI 28.84 kg/m  Constitutional: Pleasant,well-developed, female in no acute distress. HEENT: Normocephalic and atraumatic. Conjunctivae are normal. No scleral icterus. Cardiovascular: Normal rate, regular rhythm.  Pulmonary/chest: Effort normal and breath sounds normal. No wheezing, rales or rhonchi. Abdominal: Soft, nondistended, nontender. Bowel sounds active throughout. There are no masses palpable. No hepatomegaly. Extremities: No edema Neurological: Alert and oriented to person place and time. Skin: Skin is warm and dry. No rashes noted. Psychiatric: Normal mood and affect. Behavior is normal.  Labs 06/2021: TSH nml. CBC unremarkable. CMP with mildly elevated  AST and ALT of 64 and 60 respectively. HbA1C 6.4%.  CT Abd w/contrast 12/28/03: IMPRESSION  1. No evidence of upper urinary tract calculi.  2. Normal appearing kidneys.  3. At least three liver hemangiomas, the largest on the order of 6cm in the anterior segment of the right lobe. There is a hepatic cyst in the far lateral segment of the left lobe as well.  CTA chest/aorta 11/04/20: IMPRESSION: No acute finding.  Ectasia of the ascending aorta, with a maximum diameter 3.8 cm.  There is a new liver lesion incidentally imaged, at the caudal aspect of segment 4 B. this lesion is incompletely characterized, and is atypical configuration for a benign hemangioma given the exophytic nature. Referral for liver protocol are MRI is recommended, as hepatocellular carcinoma cannot be excluded  Similar appearance of liver hemangiomas, relatively unchanged dating to the CT 12/27/2003.  Aortic Atherosclerosis (ICD10-I70.0).  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 Additional cavernous hemangiomas noted within the liver parenchyma. 10 mm hypervascular lesion in the dome of the liver cannot be definitively characterized but is likely benign and also probably a cavernous hemangioma. Follow-up MRI in 6 months could be used to ensure stability.  ASSESSMENT AND PLAN: Elevated LFTs Hemangiomas Colon cancer screening Patient presents for elevated LFTs that have been noted for the last 15 years.  Her last set of LFTs was only mildly elevated, and could be suggestive of fatty liver disease.  Will rule out alternative causes of liver disease.  Since patient has had longstanding signs of liver disease, will plan to assess for the degree of fibrosis with elastography.  Patient has been noted on liver imaging in the past to have hemangiomas and was recommended for follow-up MRI to ensure stability of these hemangiomas.   I encouraged patient to continue coffee consumption and weight loss if possible.  Patient was also noted to be due for colon cancer screening her last colonoscopy was 10 years ago. - Check LFTs, INR, ferritin, iron/TIBC, IgG, ANA, ASMA, hepatitis B surface antibody, hep B surface antigen, hepatitis B core antibody, hepatitis A antibody, hep C antibody - Check elastography - Check MRI liver to follow-up on stability of hemangiomas - Continue coffee consumption - Encourage weight loss - Colonoscopy [facility]  Christia Reading, MD

## 2021-08-24 LAB — HEPATITIS B SURFACE ANTIBODY,QUALITATIVE: Hep B S Ab: NONREACTIVE

## 2021-08-24 LAB — HEPATITIS C ANTIBODY
Hepatitis C Ab: NONREACTIVE
SIGNAL TO CUT-OFF: <0.02 (ref <1.00)

## 2021-08-24 LAB — HEPATITIS A ANTIBODY, TOTAL: Hepatitis A AB,Total: NONREACTIVE

## 2021-08-24 LAB — ANTI-SMOOTH MUSCLE ANTIBODY, IGG: Actin (Smooth Muscle) Antibody (IGG): <20 U (ref <20)

## 2021-08-24 LAB — IGG: IgG (Immunoglobin G), Serum: 770 mg/dL (ref 600–1540)

## 2021-08-24 LAB — ANA: Anti Nuclear Antibody (ANA): NEGATIVE

## 2021-08-24 LAB — HEPATITIS B CORE ANTIBODY, TOTAL: Hep B Core Total Ab: NONREACTIVE

## 2021-08-24 LAB — HEPATITIS B SURFACE ANTIGEN: Hepatitis B Surface Ag: NONREACTIVE

## 2021-09-02 ENCOUNTER — Other Ambulatory Visit: Payer: Self-pay

## 2021-09-02 ENCOUNTER — Telehealth: Payer: Self-pay | Admitting: Internal Medicine

## 2021-09-02 ENCOUNTER — Ambulatory Visit (HOSPITAL_COMMUNITY)
Admission: RE | Admit: 2021-09-02 | Discharge: 2021-09-02 | Disposition: A | Discharge status: Home / Self Care | Payer: BC Managed Care – PPO | Source: Ambulatory Visit | Attending: Internal Medicine | Admitting: Internal Medicine

## 2021-09-02 DIAGNOSIS — D1800 Hemangioma unspecified site: Secondary | ICD-10-CM

## 2021-09-02 DIAGNOSIS — R7989 Other specified abnormal findings of blood chemistry: Secondary | ICD-10-CM | POA: Insufficient documentation

## 2021-09-02 IMAGING — US US ABDOMEN COMPLETE W/ ELASTOGRAPHY
1 series · 12 of 25 positions shown · non-contrast
Comparison: MRI [DATE] and [DATE]

CLINICAL DATA: Elevated LFTs.

EXAM:
ULTRASOUND ABDOMEN
ULTRASOUND HEPATIC ELASTOGRAPHY
TECHNIQUE: Sonography of the upper abdomen was performed. In addition,
ultrasound elastography evaluation of the liver was performed. A
region of interest was placed within the right lobe of the liver.
Following application of a compressive sonographic pulse, tissue
compressibility was assessed. Multiple assessments were performed at
the selected site. Median tissue compressibility was determined.
Previously, hepatic stiffness was assessed by shear wave velocity.
Based on recently published Society of Radiologists in Ultrasound
consensus article, reporting is now recommended to be performed in
the SI units of pressure (kiloPascals) representing hepatic
stiffness/elasticity. The obtained result is compared to the
published reference standards. (cACLD = compensated Advanced Chronic
Liver Disease)

[Series 1: us abdomen complete w/ elastography · 12 of 119 slices shown]
[im 5/119]
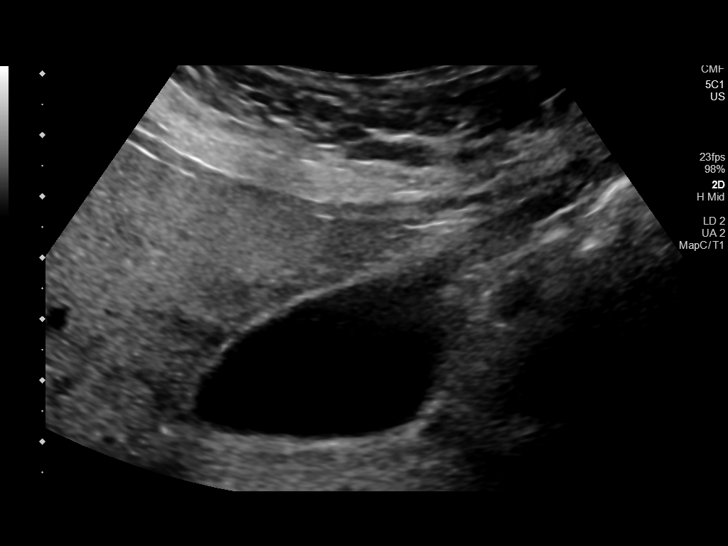
[im 15/119]
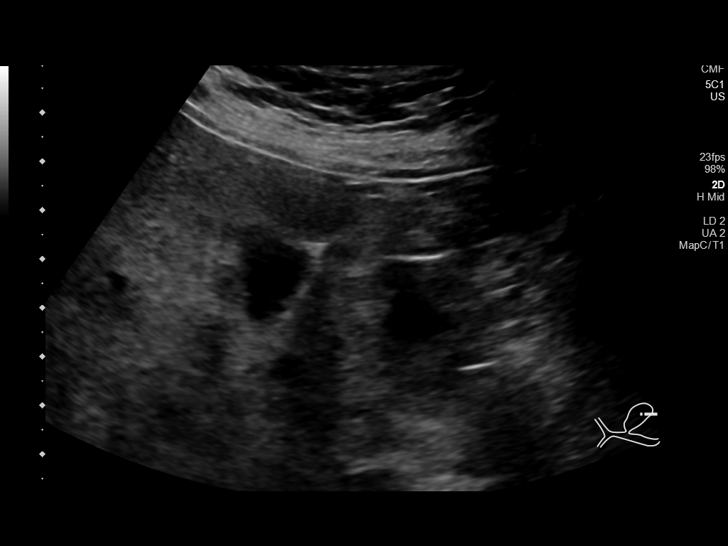
[im 25/119]
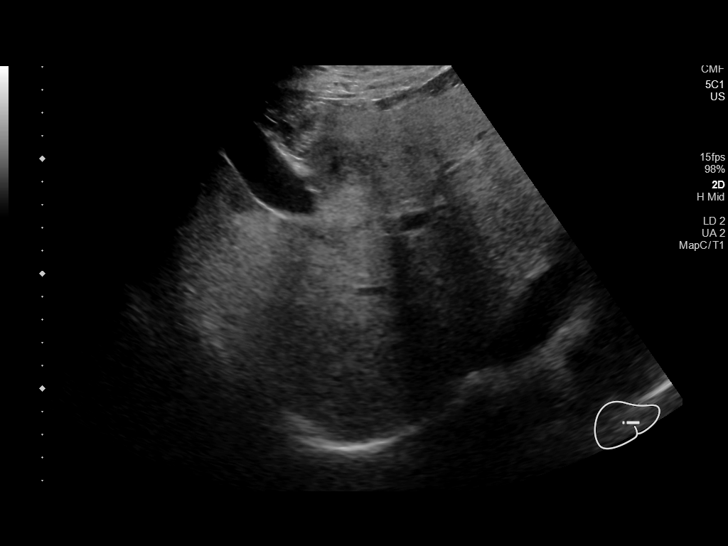
[im 35/119]
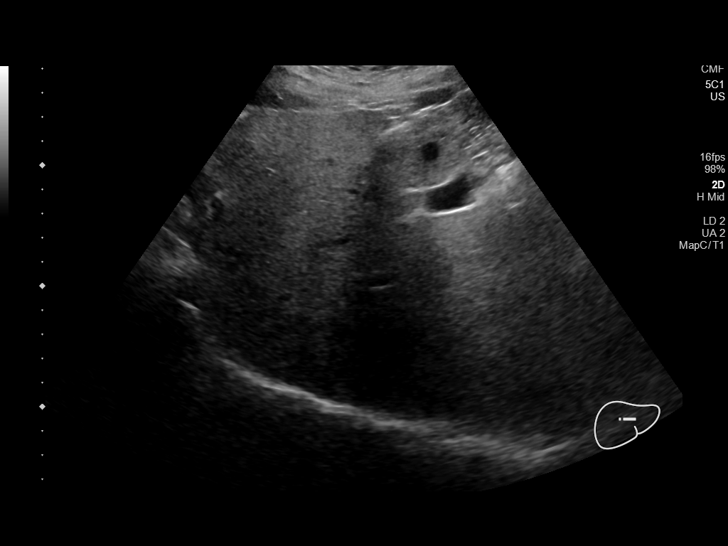
[im 45/119]
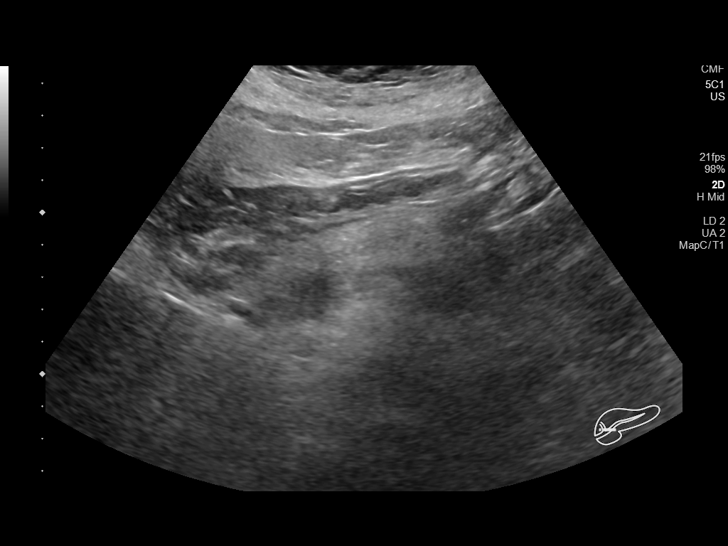
[im 55/119]
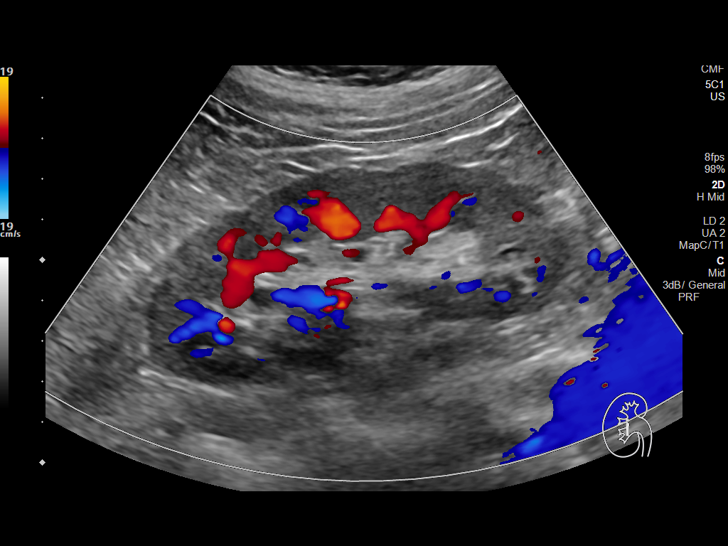
[im 64/119]
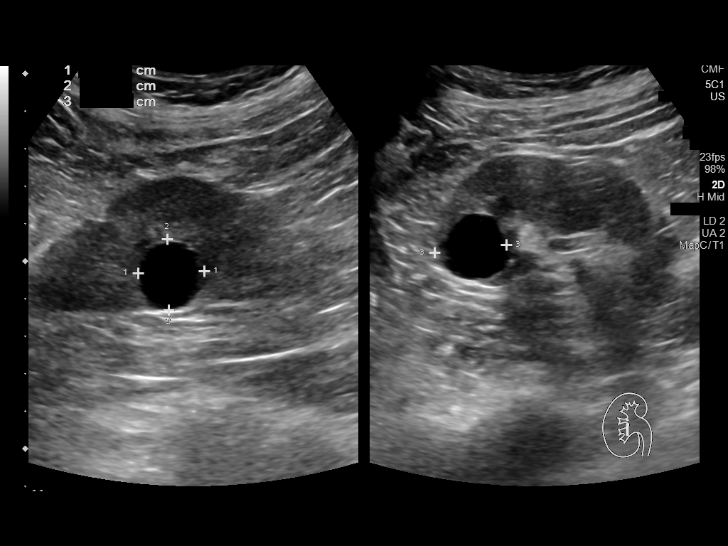
[im 74/119]
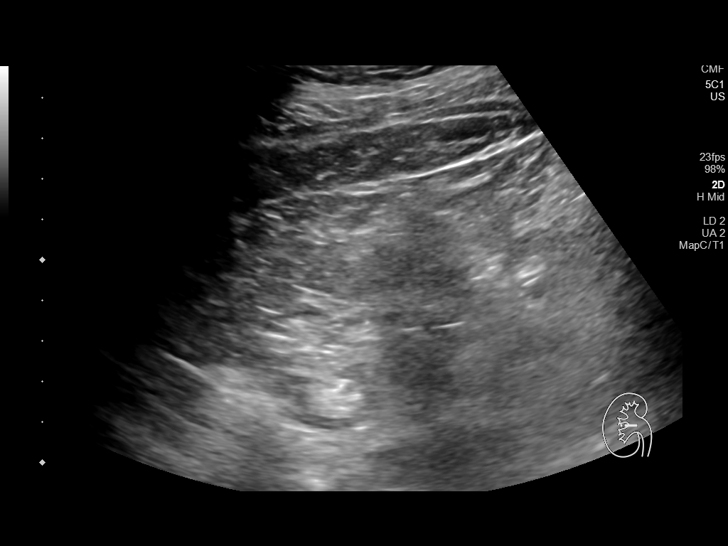
[im 84/119]
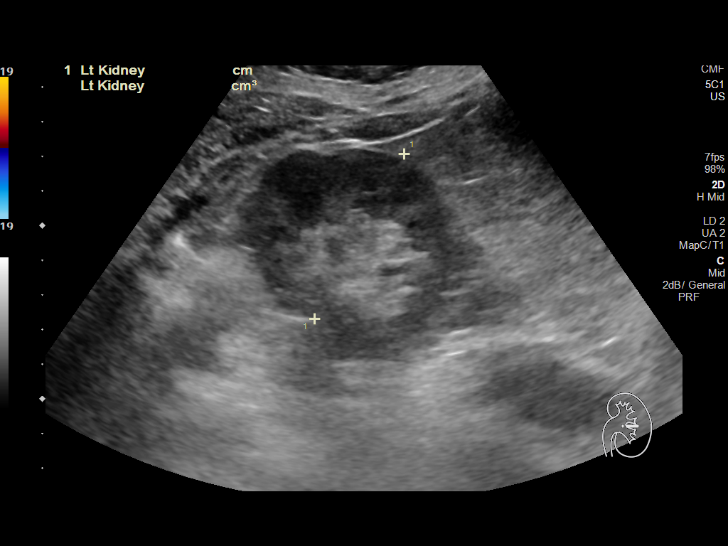
[im 94/119]
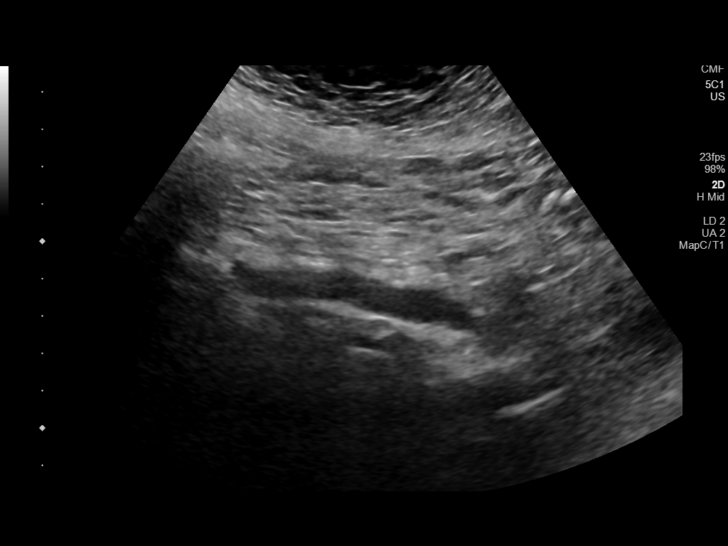
[im 104/119]
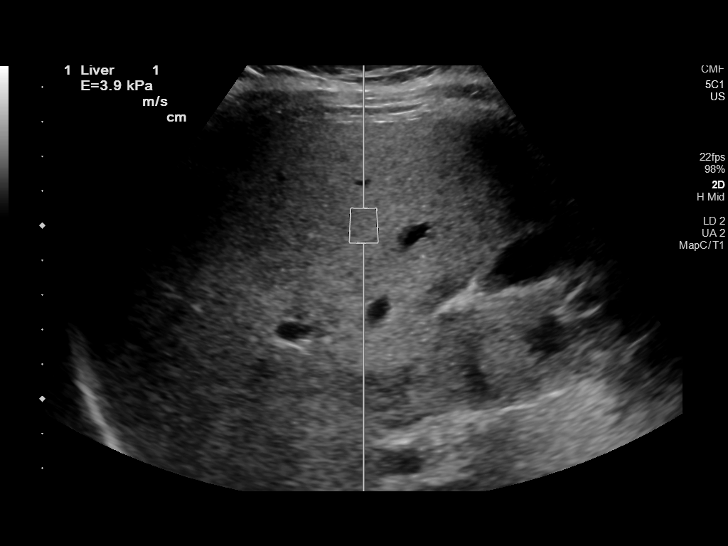
[im 114/119]
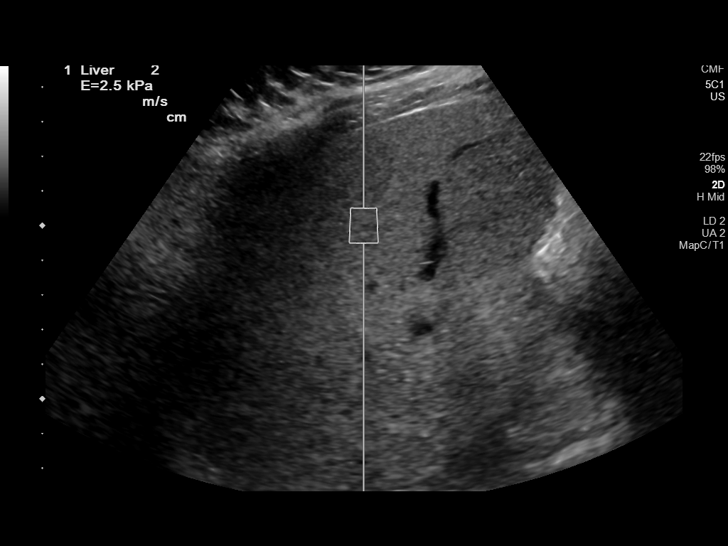

[12 of 25 positions shown; findings below may reference images not displayed]

FINDINGS: ULTRASOUND ABDOMEN

Gallbladder: No gallstones or wall thickening visualized. No
sonographic Murphy sign noted by sonographer.

Common bile duct: Diameter: 4 mm

Liver: Diffusely increased parenchymal echogenicity with relative
hypoechoic areas along the gallbladder fossa, falciform ligament and
hepatic hilum, which corresponds with hepatic steatosis and focal
fatty sparing seen on same day MRI. Portal vein is patent on color
Doppler imaging with normal direction of blood flow towards the
liver.

IVC: No abnormality visualized.

Pancreas: Visualized portion unremarkable.

Spleen: Size and appearance within normal limits.

Right Kidney: Length: 10.8 cm. Echogenicity within normal limits. No
hydronephrosis. 1.9 cm renal cyst.

Left Kidney: Length: 11 cm. Echogenicity within normal limits. No
mass or hydronephrosis visualized.

Abdominal aorta: No aneurysm visualized.

Other findings: None.

ULTRASOUND HEPATIC ELASTOGRAPHY

Device: Siemens Helix VTQ

Patient position: Supine

Transducer 5C1

Number of measurements: 10

Hepatic segment:  8

Median kPa:

IQR:

IQR/Median kPa ratio:

Data quality: IQR/Median kPa ratio of 0.3 or greater indicates
reduced accuracy

Diagnostic category:  < or = 5 kPa: high probability of being normal

The use of hepatic elastography is applicable to patients with viral
hepatitis and non-alcoholic fatty liver disease. At this time, there
is insufficient data for the referenced cut-off values and use in
other causes of liver disease, including alcoholic liver disease.
Patients, however, may be assessed by elastography and serve as
their own reference standard/baseline.

In patients with non-alcoholic liver disease, the values suggesting
compensated advanced chronic liver disease (cACLD) may be lower, and
patients may need additional testing with elasticity results of [DATE]
kPa.

Please note that abnormal hepatic elasticity and shear wave
velocities may also be identified in clinical settings other than
with hepatic fibrosis, such as: acute hepatitis, elevated right
heart and central venous pressures including use of beta blockers,
TIGER disease (TIGER), infiltrative processes such as
mastocytosis/amyloidosis/infiltrative tumor/lymphoma, extrahepatic
cholestasis, with hyperemia in the post-prandial state, and with
liver transplantation. Correlation with patient history, laboratory
data, and clinical condition recommended.

Diagnostic Categories:

< or =5 kPa: high probability of being normal

< or =9 kPa: in the absence of other known clinical signs, rules [DATE] kPa and ?13 kPa: suggestive of cACLD, but needs further testing

>13 kPa: highly suggestive of cACLD

> or =17 kPa: highly suggestive of cACLD with an increased
probability of clinically significant portal hypertension
IMPRESSION: ULTRASOUND ABDOMEN:

Hepatic steatosis with areas of focal fatty sparing along the
falciform ligament, gallbladder fossa and hepatic hilum.

ULTRASOUND HEPATIC ELASTOGRAPHY:

Median kPa:

Diagnostic category:  < or = 5 kPa: high probability of being normal

## 2021-09-02 IMAGING — MR MR ABDOMEN WO/W CM
19 series · 48 of 48 positions shown · IV contrast ([ID] GADAVIST)
Comparison: MRI [DATE]

CLINICAL DATA: Follow-up hepatic hemangiomas.  Elevated LFTs.

EXAM:
MRI ABDOMEN WITHOUT AND WITH CONTRAST
TECHNIQUE: Multiplanar multisequence MR imaging of the abdomen was performed
both before and after the administration of intravenous contrast.
CONTRAST:  7.5mL GADAVIST GADOBUTROL 1 MMOL/ML IV SOLN

[Series 3: T2 · coronal · 6.0mm · 1.56mm/px · 2 of 30 slices shown (1 of 3)]
[im 1/30]
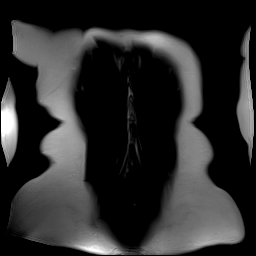
[im 30/30]
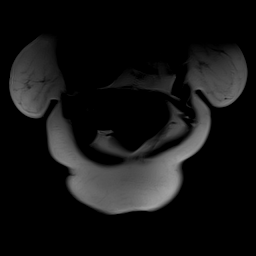

[Series 4: T2 fat-sat · axial · 6.0mm · 1.25mm/px · z∈[-74,+178]mm · 2 of 36 slices shown]
[im 1/36]
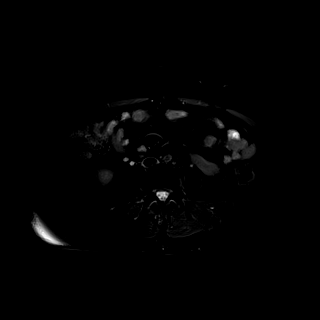
[im 36/36]
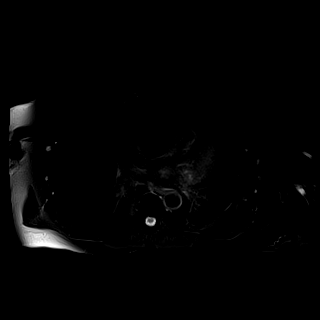

[Series 6: T1 · axial · 3.0mm · 1.25mm/px · z∈[-60,+153]mm · 3 of 72 slices shown (1 of 2)]
[im 1/72]
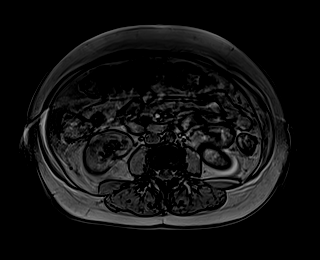
[im 36/72]
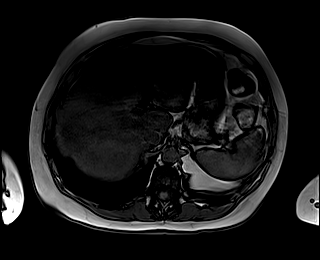
[im 72/72]
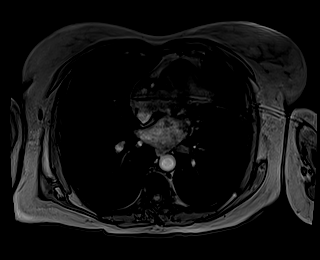

[Series 7: T1 · axial · 3.0mm · 1.25mm/px · z∈[-60,+153]mm · 3 of 72 slices shown (2 of 2)]
[im 1/72]
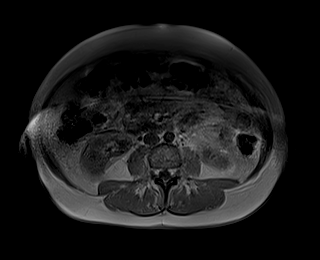
[im 36/72]
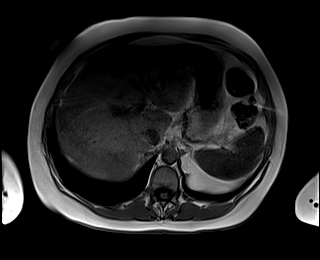
[im 72/72]
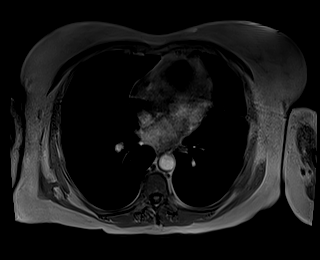

[Series 8: DWI · axial · 6.0mm · 1.49mm/px · z∈[-87,+165]mm · 3 of 72 slices shown (1 of 2)]
[im 1/72]
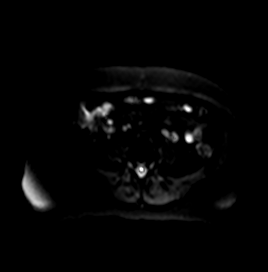
[im 36/72]
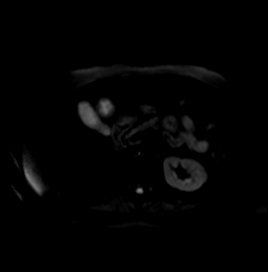
[im 72/72]
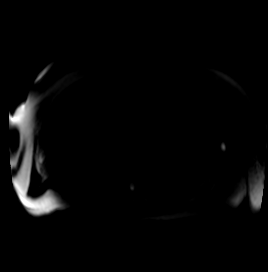

[Series 9: DWI · axial · 6.0mm · 1.49mm/px · 1 of 36 slices shown (2 of 2)]
[im 1/36]
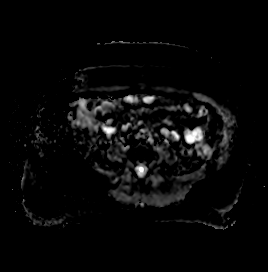

[Series 10: T2 · coronal · 6.0mm · 1.56mm/px · 1 of 34 slices shown (2 of 3)]
[im 1/34]
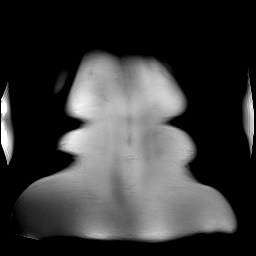

[Series 11: bSSFP · axial · 4.0mm · 0.84mm/px · z∈[-61,+167]mm · 2 of 58 slices shown]
[im 1/58]
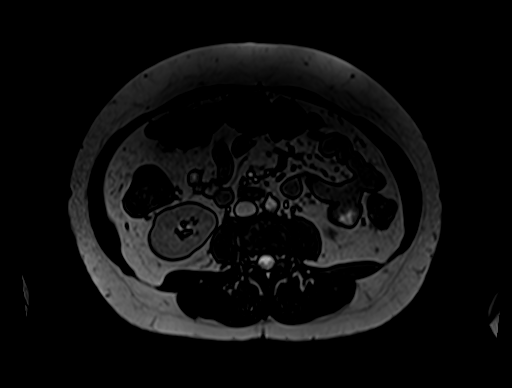
[im 58/58]
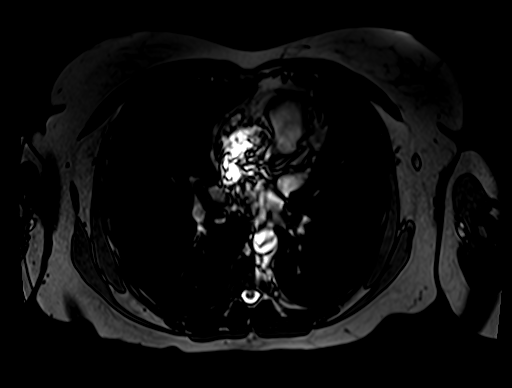

[Series 13: T1 dynamic · axial · 3.0mm · 1.25mm/px · z∈[-72,+165]mm · 3 of 80 slices shown (1 of 10)]
[im 1/80]
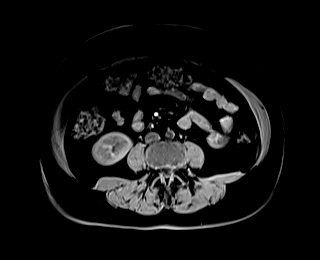
[im 40/80]
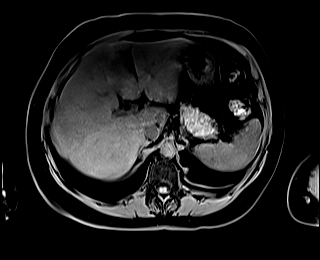
[im 80/80]
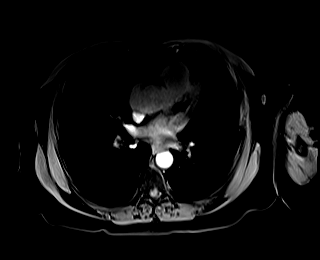

[Series 17: T1 dynamic · axial · 3.0mm · 1.25mm/px · z∈[-72,+165]mm · 3 of 80 slices shown (2 of 10)]
[im 1/80]
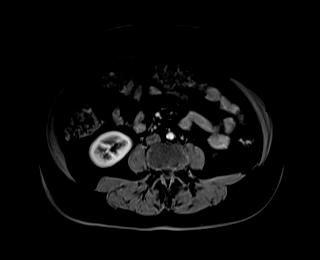
[im 40/80]
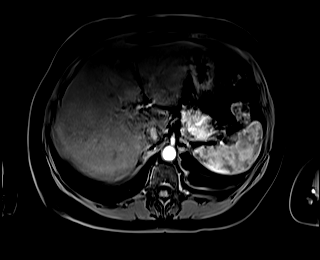
[im 80/80]
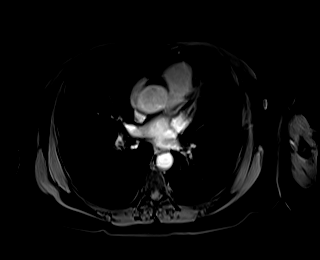

[Series 18: T1 dynamic · axial · 3.0mm · 1.25mm/px · z∈[-72,+165]mm · 3 of 80 slices shown (3 of 10)]
[im 1/80]
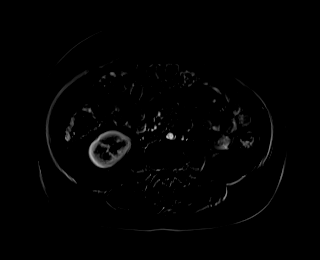
[im 40/80]
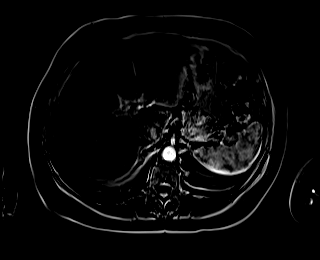
[im 80/80]
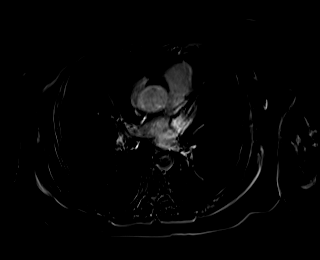

[Series 21: T1 dynamic · axial · 3.0mm · 1.25mm/px · z∈[-72,+165]mm · 3 of 80 slices shown (4 of 10)]
[im 1/80]
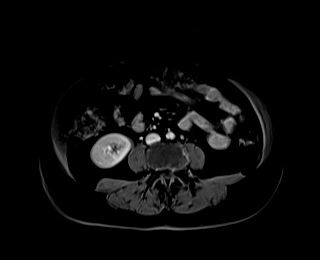
[im 40/80]
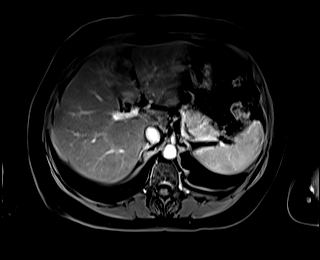
[im 80/80]
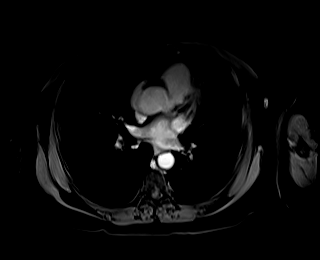

[Series 22: T1 dynamic · axial · 3.0mm · 1.25mm/px · z∈[-72,+165]mm · 3 of 80 slices shown (5 of 10)]
[im 1/80]
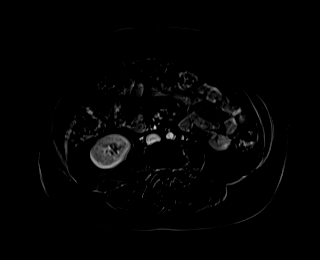
[im 40/80]
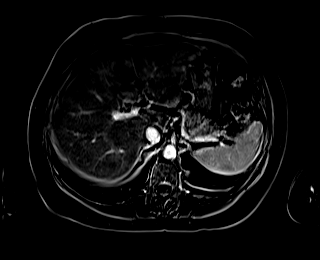
[im 80/80]
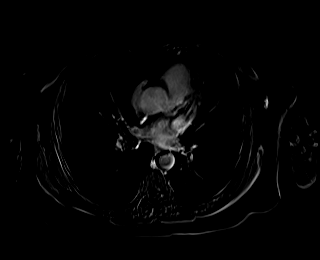

[Series 25: T1 dynamic · axial · 3.0mm · 1.25mm/px · z∈[-72,+165]mm · 3 of 80 slices shown (6 of 10)]
[im 1/80]
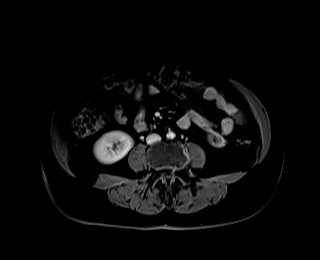
[im 40/80]
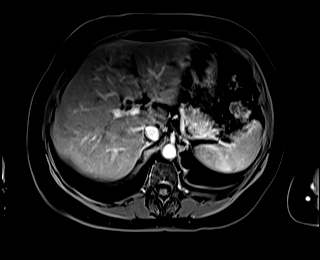
[im 80/80]
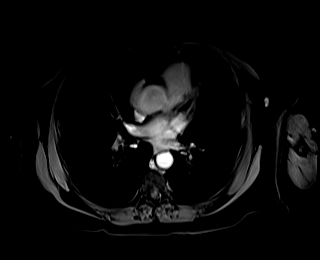

[Series 26: T1 dynamic · axial · 3.0mm · 1.25mm/px · z∈[-72,+165]mm · 3 of 80 slices shown (7 of 10)]
[im 1/80]
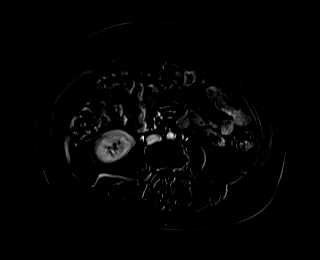
[im 40/80]
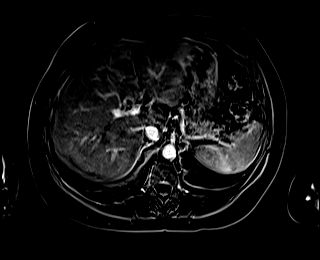
[im 80/80]
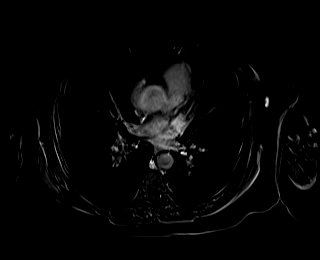

[Series 28: T1 dynamic · coronal · 3.0mm · 1.41mm/px · 3 of 72 slices shown (8 of 10)]
[im 1/72]
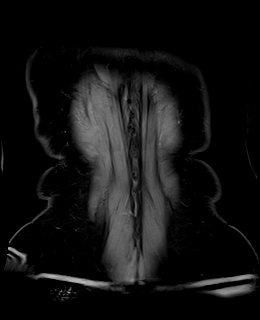
[im 36/72]
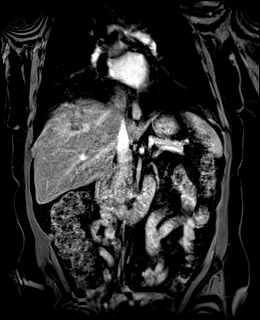
[im 72/72]
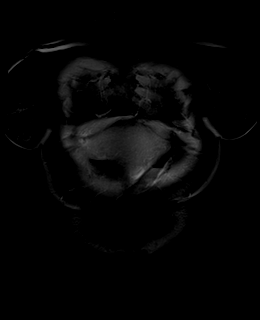

[Series 29: T2 · axial · 6.0mm · 1.56mm/px · 1 of 30 slices shown (3 of 3)]
[im 1/30]
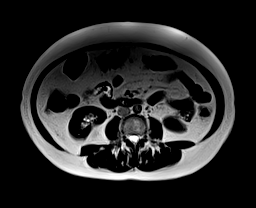

[Series 32: T1 dynamic · axial · 3.0mm · 1.25mm/px · z∈[-72,+165]mm · 3 of 80 slices shown (9 of 10)]
[im 1/80]
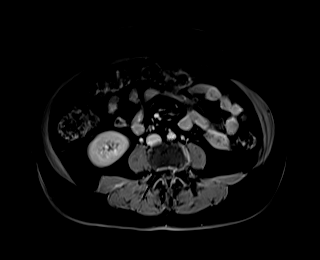
[im 40/80]
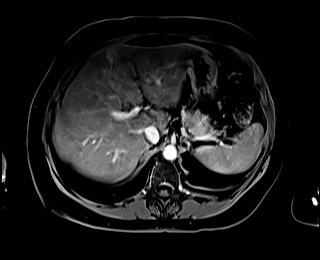
[im 80/80]
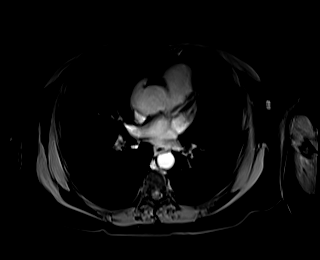

[Series 33: T1 dynamic · axial · 3.0mm · 1.25mm/px · z∈[-72,+165]mm · 3 of 80 slices shown (10 of 10)]
[im 1/80]
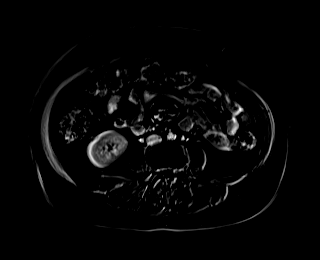
[im 40/80]
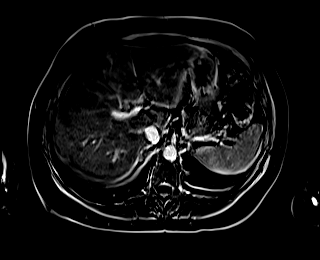
[im 80/80]
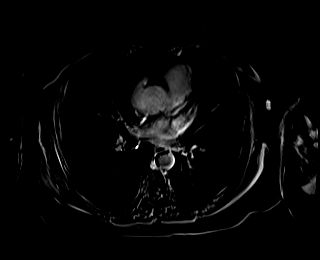

[48 of 48 positions shown; findings below may reference images not displayed]

FINDINGS: Lower chest: No acute abnormality.

Hepatobiliary: Diffuse hepatic steatosis with focal fatty sparing
along the gallbladder fossa, falciform ligament and hepatic hilum.

Multiple T2 hyperintense well-circumscribed bilobar hepatic lesions
previously characterized as hepatic hemangiomas. Lesions are as
follows:

-Hepatic hemangioma in the anterior left lobe of the liver measures
2.3 cm on image [DATE], unchanged from prior.

-Hepatic hemangioma in the posterior right hepatic lobe measures
cm on image [DATE] previously 2.8 cm.

-Hepatic hemangioma in segment IV B measures 3.6 cm on image 19/36
previously 3.5 cm.

-Enhancing focus in the hepatic dome measures 9 mm on image 19/25
corresponding with a T2 hyperintense lesion on image [DATE] stable from
prior and consistent with a benign hepatic hemangioma.

Gallbladder is unremarkable.  No biliary ductal dilation.

Pancreas: Intrinsic T1 signal of the pancreatic parenchyma is within
normal limits. Homogeneous enhancement of the pancreas postcontrast
administration. No pancreatic ductal dilation. Pancreatic divisum.
No cystic or solid hyperenhancing pancreatic lesion identified.

Spleen:  No splenomegaly or focal splenic lesion.

Adrenals/Urinary Tract: Bilateral adrenal glands appear normal. No
hydronephrosis. Stable 18 mm right renal cyst. Additional tiny T2
hyperintense bilateral renal lesions are technically too small to
accurately characterize but statistically likely reflect cysts. No
solid enhancing renal mass.

Stomach/Bowel: Visualized portions within the abdomen are
unremarkable.

Vascular/Lymphatic: Normal caliber aorta. No pathologically enlarged
abdominal lymph nodes.

Other:  No abdominal free fluid.

Musculoskeletal: No suspicious bone lesions identified.
IMPRESSION: 1. Stable bilobar hepatic hemangiomas, including the previously
described probable hemangioma in the hepatic dome which was better
evaluated on today's examination.
2. Diffuse hepatic steatosis with focal fatty sparing along the
gallbladder fossa, falciform ligament and hepatic hilum.

## 2021-09-02 MED ORDER — GADOBUTROL 1 MMOL/ML IV SOLN
7.5000 mL | Freq: Once | INTRAVENOUS | Status: AC | PRN
  Administered 2021-09-02: 7.5 mL via INTRAVENOUS

## 2021-09-02 NOTE — Telephone Encounter (Signed)
Patient called to get MRI results.

## 2021-09-02 NOTE — Telephone Encounter (Signed)
Ms. Harter, your MRI showed that your benign liver hemangiomas are stable from prior. This is good news!  Written by Sharyn Creamer, MD on 09/02/2021  2:21 PM EST Seen by patient Daiva Nakayama on 09/02/2021  3:44 PM

## 2021-09-09 ENCOUNTER — Telehealth: Payer: Self-pay | Admitting: Internal Medicine

## 2021-09-09 NOTE — Telephone Encounter (Signed)
Inbound call from patient stated she would like to discuss results from MRI and CT done on 2/28. Please advise ?

## 2021-09-09 NOTE — Telephone Encounter (Signed)
Returned pt call. States she did not have any questions about her results but rather how to treat a fatty liver. Education provided to abstain from alcohol, reduce fatty food intake and reduce wt. Wanted to know if there is any further medications or treatments for a fatty liver. Again provided recommendations as previously documented. Verbalized acceptance and understanding. ?

## 2021-09-30 ENCOUNTER — Encounter: Payer: Self-pay | Admitting: Internal Medicine

## 2021-10-06 ENCOUNTER — Encounter: Payer: Self-pay | Admitting: Internal Medicine

## 2021-10-06 ENCOUNTER — Ambulatory Visit (AMBULATORY_SURGERY_CENTER): Payer: BC Managed Care – PPO | Admitting: Internal Medicine

## 2021-10-06 VITALS — BP 134/70 | HR 55 | Temp 96.8°F | Resp 28 | Ht 64.0 in | Wt 168.0 lb

## 2021-10-06 DIAGNOSIS — Z1211 Encounter for screening for malignant neoplasm of colon: Secondary | ICD-10-CM | POA: Diagnosis present

## 2021-10-06 DIAGNOSIS — K625 Hemorrhage of anus and rectum: Secondary | ICD-10-CM

## 2021-10-06 MED ORDER — SODIUM CHLORIDE 0.9 % IV SOLN: 500.0000 mL | Freq: Once | INTRAVENOUS | Status: DC

## 2021-10-06 NOTE — Progress Notes (Signed)
No problems noted in the recovery room. maw 

## 2021-10-06 NOTE — Op Note (Addendum)
[place] ?Patient Name: Sandra Willis ?Procedure Date: 10/06/2021 2:57 PM ?MRN: [id number] ?Endoscopist: Sonny Masters "Christia Reading ,  ?Age: 66 ?Referring MD:  ?Date of Birth: 03/04/1956 ?Gender: Female ?Account #: [id number] ?Procedure:                Colonoscopy ?Indications:              Screening for colorectal malignant neoplasm ?Medicines:                Monitored Anesthesia Care ?Procedure:                Pre-Anesthesia Assessment: ?                          - Prior to the procedure, a History and Physical  ?                          was performed, and patient medications and  ?                          allergies were reviewed. The patient's tolerance of  ?                          previous anesthesia was also reviewed. The risks  ?                          and benefits of the procedure and the sedation  ?                          options and risks were discussed with the patient.  ?                          All questions were answered, and informed consent  ?                          was obtained. Prior Anticoagulants: The patient has  ?                          taken no previous anticoagulant or antiplatelet  ?                          agents. ASA Grade Assessment: II - A patient with  ?                          mild systemic disease. After reviewing the risks  ?                          and benefits, the patient was deemed in  ?                          satisfactory condition to undergo the procedure. ?                          After obtaining informed consent, the colonoscope  ?  was passed under direct vision. Throughout the  ?                          procedure, the patient's blood pressure, pulse, and  ?                          oxygen saturations were monitored continuously. The  ?                          Olympus CF-[id number] (#[id number]) Colonoscope was  ?                          introduced through the anus and advanced to the the  ?                          terminal ileum.  The colonoscopy was performed  ?                          without difficulty. The patient tolerated the  ?                          procedure well. The quality of the bowel  ?                          preparation was good. The terminal ileum, ileocecal  ?                          valve, appendiceal orifice, and rectum were  ?                          photographed. ?Scope In: 3:16:32 PM ?Scope Out: 3:30:43 PM ?Scope Withdrawal Time: 0 hours 10 minutes 49 seconds  ?Total Procedure Duration: 0 hours 14 minutes 11 seconds  ?Findings:                 The terminal ileum appeared normal. ?                          Multiple small and large-mouthed diverticula were  ?                          found in the entire colon. ?                          Non-bleeding internal hemorrhoids were found during  ?                          retroflexion. ?Complications:            No immediate complications. ?Estimated Blood Loss:     Estimated blood loss was minimal. ?Impression:               - The examined portion of the ileum was normal. ?                          - Diverticulosis in the entire examined colon. ?                          -  Non-bleeding internal hemorrhoids. ?                          - No specimens collected. ?Recommendation:           - Discharge patient to home (with escort). ?                          - Repeat colonoscopy in 10 years for screening  ?                          purposes. ?                          - The findings and recommendations were discussed  ?                          with the patient. ?                          - Return to GI clinic in 2 months. ?Georgian Co,  ?10/06/2021 3:34:09 PM ?

## 2021-10-06 NOTE — Patient Instructions (Addendum)
Handouts were given to your care partner on diverticulosis and hemorrhoids. ?Your sugar was 105 in the recovery room. ?Resume your current medications today.  I would take evening dose of Metformin. ?Repeat screening colonoscopy in 10 years. ?Please call if any questions or concerns. ?  ? ? ?YOU HAD AN ENDOSCOPIC PROCEDURE TODAY AT [facility]:   Refer to the procedure report that was given to you for any specific questions about what was found during the examination.  If the procedure report does not answer your questions, please call your gastroenterologist to clarify.  If you requested that your care partner not be given the details of your procedure findings, then the procedure report has been included in a sealed envelope for you to review at your convenience later. ? ?YOU SHOULD EXPECT: Some feelings of bloating in the abdomen. Passage of more gas than usual.  Walking can help get rid of the air that was put into your GI tract during the procedure and reduce the bloating. If you had a lower endoscopy (such as a colonoscopy or flexible sigmoidoscopy) you may notice spotting of blood in your stool or on the toilet paper. If you underwent a bowel prep for your procedure, you may not have a normal bowel movement for a few days. ? ?Please Note:  You might notice some irritation and congestion in your nose or some drainage.  This is from the oxygen used during your procedure.  There is no need for concern and it should clear up in a day or so. ? ?SYMPTOMS TO REPORT IMMEDIATELY: ? ?Following lower endoscopy (colonoscopy or flexible sigmoidoscopy): ? Excessive amounts of blood in the stool ? Significant tenderness or worsening of abdominal pains ? Swelling of the abdomen that is new, acute ? Fever of 100?F or higher ? ?For urgent or emergent issues, a gastroenterologist can be reached at any hour by calling [phone]. ?Do not use MyChart messaging for urgent concerns.  ? ? ?DIET:  We do recommend  a small meal at first, but then you may proceed to your regular diet.  Drink plenty of fluids but you should avoid alcoholic beverages for 24 hours. ? ?ACTIVITY:  You should plan to take it easy for the rest of today and you should NOT DRIVE or use heavy machinery until tomorrow (because of the sedation medicines used during the test).   ? ?FOLLOW UP: ?Our staff will call the number listed on your records 48-72 hours following your procedure to check on you and address any questions or concerns that you may have regarding the information given to you following your procedure. If we do not reach you, we will leave a message.  We will attempt to reach you two times.  During this call, we will ask if you have developed any symptoms of COVID 19. If you develop any symptoms (ie: fever, flu-like symptoms, shortness of breath, cough etc.) before then, please call [phone].  If you test positive for Covid 19 in the 2 weeks post procedure, please call and report this information to [place].   ? ?If any biopsies were taken you will be contacted by phone or by letter within the next 1-3 weeks.  Please call us at [phone] if you have not heard about the biopsies in 3 weeks.  ? ? ?SIGNATURES/CONFIDENTIALITY: ?You and/or your care partner have signed paperwork which will be entered into your electronic medical record.  These signatures attest to the fact that that the  information above on your After Visit Summary has been reviewed and is understood.  Full responsibility of the confidentiality of this discharge information lies with you and/or your care-partner.  ?

## 2021-10-06 NOTE — Progress Notes (Signed)
VS by DT    

## 2021-10-06 NOTE — Progress Notes (Signed)
To Pacu, VSS. Report to [email or web address] 

## 2021-10-06 NOTE — Progress Notes (Signed)
? ?GASTROENTEROLOGY PROCEDURE H&P NOTE  ? ?Primary Care Physician: ?Glenda Chroman, MD ? ? ? ?Reason for Procedure:  Colon cancer screening ? ?Plan:    Colonoscopy ? ?Patient is appropriate for endoscopic procedure(s) in the ambulatory ([facility]) setting. ? ?The nature of the procedure, as well as the risks, benefits, and alternatives were carefully and thoroughly reviewed with the patient. Ample time for discussion and questions allowed. The patient understood, was satisfied, and agreed to proceed.  ? ? ? ?HPI: ?Sandra Willis is a 66 y.o. female who presents for colonoscopy for evaluation of colon cancer screening .  Patient was most recently seen in the Gastroenterology Clinic on 08/21/21.  No interval change in medical history since that appointment. Please refer to that note for full details regarding GI history and clinical presentation.  ? ?Past Medical History:  ?Diagnosis Date  ? Anxiety   ? Aortic atherosclerosis ([place])   ? Depression   ? Dermatitis   ? due to plants (poison ivy, sumac, oak)  ? Diabetes ([place])   ? Fatty liver disease, nonalcoholic   ? Pt is under care of specialist  ? High blood pressure   ? High cholesterol   ? IBS (irritable bowel syndrome)   ? Low back pain   ? Plantar fasciitis   ? Varicose veins of left lower extremity   ? Vitamin D deficiency   ? ? ?Past Surgical History:  ?Procedure Laterality Date  ? CARPAL TUNNEL RELEASE  2020  ? cataracts Bilateral   ? DILATION AND CURETTAGE OF UTERUS    ? ELBOW SURGERY    ? hemeroidectomy    ? ? ?Prior to Admission medications   ?Medication Sig Start Date End Date Taking? Authorizing Provider  ?atenolol-chlorthalidone (TENORETIC) 50-25 MG tablet Take 0.5 tablets by mouth daily.   Yes [provider]  ?ergocalciferol (VITAMIN D2) 50000 units capsule Take 50,000 Units by mouth once a week.   Yes [provider]  ?metFORMIN (GLUCOPHAGE) 500 MG tablet Take 500 mg by mouth 2 (two) times daily. 05/22/21  Yes [provider]   ?Multiple Vitamin (MULTIVITAMIN) tablet Take 1 tablet by mouth daily.   Yes [provider]  ?PARoxetine (PAXIL) 20 MG tablet Take 1 tablet (20 mg total) by mouth daily. 09/17/16 10/06/21 Yes Charlcie Cradle, MD  ?furosemide (LASIX) 20 MG tablet TAKE 1 TABLET BY MOUTH EVERY DAY ?Patient not taking: Reported on 10/06/2021 03/14/21   Nigel Mormon, MD  ? ? ?Current Outpatient Medications  ?Medication Sig Dispense Refill  ? atenolol-chlorthalidone (TENORETIC) 50-25 MG tablet Take 0.5 tablets by mouth daily.    ? ergocalciferol (VITAMIN D2) 50000 units capsule Take 50,000 Units by mouth once a week.    ? metFORMIN (GLUCOPHAGE) 500 MG tablet Take 500 mg by mouth 2 (two) times daily.    ? Multiple Vitamin (MULTIVITAMIN) tablet Take 1 tablet by mouth daily.    ? PARoxetine (PAXIL) 20 MG tablet Take 1 tablet (20 mg total) by mouth daily. 30 tablet 3  ? furosemide (LASIX) 20 MG tablet TAKE 1 TABLET BY MOUTH EVERY DAY (Patient not taking: Reported on 10/06/2021) 90 tablet 1  ? ?Current Facility-Administered Medications  ?Medication Dose Route Frequency Provider Last Rate Last Admin  ? 0.9 %  sodium chloride infusion  500 mL Intravenous Once Sharyn Creamer, MD      ? ? ?Allergies as of 10/06/2021 - Review Complete 10/06/2021  ?Allergen Reaction Noted  ? Fenofibrate  08/18/2021  ?  Statins Other (See Comments) 02/11/2016  ? Bactrim [sulfamethoxazole-trimethoprim] Rash 08/18/2021  ? Sulfa antibiotics Rash 02/11/2016  ? Zithromax [azithromycin] Rash 02/11/2016  ? ? ?Family History  ?Problem Relation Age of Onset  ? Cancer Mother   ? Diabetes Mother   ? Alcohol abuse Father   ? Heart disease Father   ? High Cholesterol Father   ? Hypertension Father   ? Personality disorder Sister   ? Drug abuse Sister   ? Diabetes Sister   ? Alcohol abuse Brother   ? Colon cancer Neg Hx   ? Rectal cancer Neg Hx   ? Stomach cancer Neg Hx   ? ? ?Social History  ? ?Socioeconomic History  ? Marital status: Married  ?  Spouse name: Not on file   ? Number of children: 2  ? Years of education: Not on file  ? Highest education level: Not on file  ?Occupational History  ? Not on file  ?Tobacco Use  ? Smoking status: Never  ? Smokeless tobacco: Never  ?Vaping Use  ? Vaping Use: Never used  ?Substance and Sexual Activity  ? Alcohol use: No  ?  Comment: quit 2000  ? Drug use: No  ? Sexual activity: Yes  ?  Partners: Male  ?Other Topics Concern  ? Not on file  ?Social History Narrative  ? Pt lives in [place] with her husband. Pt has 2 adult children and has been married 2 yrs. They got married on her 12th birthday. Born and raised in [place] by her parents. Pt has one older brother and one older sister. 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 Caffeine 2 cups coffee on sat/ sun.  Sodas 1-2 daily during week.   Education 12th grade.   ? ?Social Determinants of Health  ? ?Financial Resource Strain: Not on file  ?Food Insecurity: Not on file  ?Transportation Needs: Not on file  ?Physical Activity: Not on file  ?Stress: Not on file  ?Social Connections: Not on file  ?Intimate Partner Violence: Not on file  ? ? ?Physical Exam: ?Vital signs in last 24 hours: ?BP (!) 111/53   Pulse 75   Temp (!) 96.8 ?F (36 ?C)   Ht '5\' 4"'$  (1.626 m)   Wt 168 lb (76.2 kg)   SpO2 98%   BMI 28.84 kg/m?  ?GEN: NAD ?EYE: Sclerae anicteric ?ENT: MMM ?CV: Non-tachycardic ?Pulm: No increased WOB ?GI: Soft ?NEURO:  Alert & Oriented ? ? ?Christia Reading, MD ?[facility] ? ? ?10/06/2021 3:04 PM ? ?

## 2021-10-08 ENCOUNTER — Telehealth: Payer: Self-pay | Admitting: *Deleted

## 2021-10-08 NOTE — Telephone Encounter (Signed)
?  Follow up Call- ? ? ?  10/06/2021  ?  2:51 PM  ?Call back number  ?Post procedure Call Back phone  # [phone]  ?Permission to leave phone message Yes  ?  ? ?Patient questions: ? ?Do you have a fever, pain , or abdominal swelling? No. ?Pain Score  0 * ? ?Have you tolerated food without any problems? Yes.   ? ?Have you been able to return to your normal activities? Yes.   ? ?Do you have any questions about your discharge instructions: ?Diet   No. ?Medications  No. ?Follow up visit  No. ? ?Do you have questions or concerns about your Care? No. ? ?Actions: ?* If pain score is 4 or above: ?No action needed, pain <4. ? ? ? ? ?

## 2021-10-08 NOTE — Telephone Encounter (Signed)
First follow up call attempt.  Unable to leave voicemail. ?

## 2022-04-27 ENCOUNTER — Telehealth: Payer: Self-pay | Admitting: Internal Medicine

## 2022-04-27 NOTE — Telephone Encounter (Signed)
Patient called asking when she is due for an MRI

## 2022-04-27 NOTE — Telephone Encounter (Signed)
MRI that was done in April of this year does not mention recommendation for f/u imaging. The patient is past due a follow up appointment. Do you want any follow up imaging or have her come see you first?

## 2022-04-28 NOTE — Telephone Encounter (Signed)
Spoke with the patient and scheduled her appointment.

## 2022-06-11 ENCOUNTER — Encounter: Payer: Self-pay | Admitting: Internal Medicine

## 2022-06-11 ENCOUNTER — Ambulatory Visit (INDEPENDENT_AMBULATORY_CARE_PROVIDER_SITE_OTHER): Payer: Medicare Other | Admitting: Internal Medicine

## 2022-06-11 VITALS — BP 120/70 | HR 69 | Ht 64.0 in | Wt 163.4 lb

## 2022-06-11 DIAGNOSIS — K760 Fatty (change of) liver, not elsewhere classified: Secondary | ICD-10-CM | POA: Diagnosis not present

## 2022-06-11 NOTE — Progress Notes (Addendum)
Chief Complaint: Elevated LFTs, fatty liver  HPI : 66 year old female with history of DM, HLD, anxiety, depression, asthma, IBS presents for follow up of elevated LFTs, fatty liver  Interval History: She has lost weight, about 5 lbs since her last visit. This weight has primarily been due to diet changes. Patient has been working on reducing caloric intake. She has been eating more veggies and cut down on desserts. Has not significantly increased her physical activity yet. Otherwise she is doing well.   Wt Readings from Last 3 Encounters:  06/11/22 163 lb 6 oz (74.1 kg)  10/06/21 168 lb (76.2 kg)  08/21/21 168 lb (76.2 kg)   Current Outpatient Medications  Medication Sig Dispense Refill   atenolol-chlorthalidone (TENORETIC) 50-25 MG tablet Take 0.5 tablets by mouth daily.     ergocalciferol (VITAMIN D2) 50000 units capsule Take 50,000 Units by mouth once a week.     furosemide (LASIX) 20 MG tablet TAKE 1 TABLET BY MOUTH EVERY DAY 90 tablet 1   metFORMIN (GLUCOPHAGE) 500 MG tablet Take 500 mg by mouth 2 (two) times daily.     Multiple Vitamin (MULTIVITAMIN) tablet Take 1 tablet by mouth daily.     potassium chloride (MICRO-K) 10 MEQ CR capsule Take 1 capsule twice a day by oral route for 10 days.     PARoxetine (PAXIL) 20 MG tablet Take 1 tablet (20 mg total) by mouth daily. 30 tablet 3   No current facility-administered medications for this visit.   Physical Exam: BP 120/70 (BP Location: Left Arm, Patient Position: Sitting, Cuff Size: Normal)   Pulse 69   Ht '5\' 4"'$  (1.626 m)   Wt 163 lb 6 oz (74.1 kg)   SpO2 96%   BMI 28.04 kg/m  Constitutional: Pleasant,well-developed, female in no acute distress. HEENT: Normocephalic and atraumatic. Conjunctivae are normal. No scleral icterus. Cardiovascular: Normal rate, regular rhythm.  Pulmonary/chest: Effort normal and breath sounds normal. No wheezing, rales or rhonchi. Abdominal: Soft, nondistended, nontender. Bowel sounds active  throughout. There are no masses palpable. No hepatomegaly. Extremities: No edema Neurological: Alert and oriented to person place and time. Skin: Skin is warm and dry. No rashes noted. Psychiatric: Normal mood and affect. Behavior is normal.  Labs 06/2021: TSH nml. CBC unremarkable. CMP with mildly elevated AST and ALT of 64 and 60 respectively. HbA1C 6.4%.  Labs 08/2021: LFTs with elevated AST of 39 and ALT of 44. Hep B surface antigen negative. Hep B surface antibody negative. HCV Ab NR. Hepatitis A antibody NR. ANA, IgG, and ASMA normal. INR nml. Ferritin mildly elevated at 556.1.  CT Abd w/contrast 12/28/03: IMPRESSION  1. No evidence of upper urinary tract calculi.  2. Normal appearing kidneys.  3. At least three liver hemangiomas, the largest on the order of 6cm in the anterior segment of the right lobe. There is a hepatic cyst in the far lateral segment of the left lobe as well.  CTA chest/aorta 11/04/20: IMPRESSION: No acute finding.  Ectasia of the ascending aorta, with a maximum diameter 3.8 cm.  There is a new liver lesion incidentally imaged, at the caudal aspect of segment 4 B. this lesion is incompletely characterized, and is atypical configuration for a benign hemangioma given the exophytic nature. Referral for liver protocol are MRI is recommended, as hepatocellular carcinoma cannot be excluded  Similar appearance of liver hemangiomas, relatively unchanged dating to the CT 12/27/2003.  Aortic Atherosclerosis (ICD10-I70.0).  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 Additional cavernous hemangiomas noted within the liver parenchyma. 10 mm hypervascular lesion in the dome of the liver cannot be definitively characterized but is likely benign and also probably a cavernous hemangioma. Follow-up MRI in 6 months could be used to ensure stability.  U/S Abd w/elastography  09/02/21: ULTRASOUND ABDOMEN: Hepatic steatosis with areas of focal fatty sparing along the falciform ligament, gallbladder fossa and hepatic hilum. ULTRASOUND HEPATIC ELASTOGRAPHY: Median kPa:  3.4 Diagnostic category:  < or = 5 kPa: high probability of being normal  MR Liver w/contrast 09/02/21: IMPRESSION: 1. Stable bilobar hepatic hemangiomas, including the previously described probable hemangioma in the hepatic dome which was better evaluated on today's examination. 2. Diffuse hepatic steatosis with focal fatty sparing along the gallbladder fossa, falciform ligament and hepatic hilum.  Colonoscopy 10/06/21: - The examined portion of the ileum was normal. - Diverticulosis in the entire examined colon. - Non-bleeding internal hemorrhoids. - No specimens collected.  ASSESSMENT AND PLAN: Fatty liver disease Hemangiomas Colon cancer screening Patient has had elevated LFTs for years due to fatty liver disease. Her recent work up did not reveal any alternative explanations for elevated LFTs. She is not immune against hepatitis A and B and thus would benefit from vaccinations in order to prevent further injury to her liver. Her recent elastography test showed that she likely does not have advanced liver disease.  - Will get liver tests that were collected yesterday from her PCP - Will write a letter to her PCP letting them know that she needs the Twinrix vaccination - Continue coffee consumption - Continue weight loss - RTC in 1 year  Christia Reading, MD  I spent 33 minutes of time, including in depth chart review, independent review of results as outlined above, communicating results with the patient directly, face-to-face time with the patient, coordinating care, and ordering studies and medications as appropriate, and documentation.  ADDENDUM: Received labs from PCP.  Labs 06/2022: TSH nml. CMP with mildly low K of 3.4, mildly elevated calcium of 10.6, mildly elevated albumin of 5.1,  elevated AST of 75, and elevated ALT of 88. CBC with elevated Hb of 16.5. Per review of the patient's lab trends, she has had elevated AST and ALT extending as far back as 2014.

## 2022-06-11 NOTE — Patient Instructions (Addendum)
Please check with your Primary Care Physician regarding TwinRX ( Hep A and Hep B vaccine).It is recommended by Dr. Lorenso Courier.   We will request your labs from your Primary Care Physician.   Follow-up in 1 year, sooner if needed.    _______________________________________________________  If you are age 65 or older, your body mass index should be between 23-30. Your Body mass index is 28.04 kg/m. If this is out of the aforementioned range listed, please consider follow up with your Primary Care Provider.  If you are age 39 or younger, your body mass index should be between 19-25. Your Body mass index is 28.04 kg/m. If this is out of the aformentioned range listed, please consider follow up with your Primary Care Provider.   ________________________________________________________  The [facility] providers would like to encourage you to use [facility] to communicate with providers for non-urgent requests or questions.  Due to long hold times on the telephone, sending your provider a message by [facility] may be a faster and more efficient way to get a response.  Please allow 48 business hours for a response.  Please remember that this is for non-urgent requests.  _______________________________________________________  It was a pleasure to see you today!  Dr. Dayna Barker

## 2022-08-20 ENCOUNTER — Ambulatory Visit: Payer: Medicare Other | Admitting: Cardiology

## 2022-08-26 ENCOUNTER — Ambulatory Visit: Payer: Medicare Other | Admitting: Cardiology

## 2022-09-09 ENCOUNTER — Ambulatory Visit: Payer: Medicare Other | Admitting: Cardiology

## 2022-11-23 ENCOUNTER — Encounter: Payer: Self-pay | Admitting: Cardiology

## 2022-11-23 ENCOUNTER — Ambulatory Visit
Admission: RE | Admit: 2022-11-23 | Discharge: 2022-11-23 | Disposition: A | Discharge status: Home / Self Care | Payer: Medicare Other | Source: Ambulatory Visit | Attending: Cardiology | Admitting: Cardiology

## 2022-11-23 ENCOUNTER — Ambulatory Visit: Payer: Medicare Other | Admitting: Cardiology

## 2022-11-23 VITALS — BP 127/79 | HR 75 | Ht 64.0 in | Wt 166.0 lb

## 2022-11-23 DIAGNOSIS — G720 Drug-induced myopathy: Secondary | ICD-10-CM | POA: Insufficient documentation

## 2022-11-23 DIAGNOSIS — R931 Abnormal findings on diagnostic imaging of heart and coronary circulation: Secondary | ICD-10-CM

## 2022-11-23 DIAGNOSIS — I77810 Thoracic aortic ectasia: Secondary | ICD-10-CM

## 2022-11-23 DIAGNOSIS — I10 Essential (primary) hypertension: Secondary | ICD-10-CM | POA: Insufficient documentation

## 2022-11-23 DIAGNOSIS — E782 Mixed hyperlipidemia: Secondary | ICD-10-CM

## 2022-11-23 MED ORDER — IOPAMIDOL (ISOVUE-370) INJECTION 76%
75.0000 mL | Freq: Once | INTRAVENOUS | Status: AC | PRN
  Administered 2022-11-23: 75 mL via INTRAVENOUS

## 2022-11-23 MED ORDER — EVOLOCUMAB 140 MG/ML ~~LOC~~ SOAJ
140.0000 mg | Freq: Once | SUBCUTANEOUS | Status: AC
Start: 2022-11-23 — End: 2022-11-23
  Administered 2022-11-23: 140 mg via SUBCUTANEOUS

## 2022-11-23 MED ORDER — REPATHA SURECLICK 140 MG/ML ~~LOC~~ SOAJ: 140.0000 mg | SUBCUTANEOUS | 5 refills | Status: DC

## 2022-11-23 NOTE — Progress Notes (Signed)
Patient referred by Ignatius Specking, MD for hyperlidemia  Subjective:   Sandra Willis, female    DOB: [birth year], 67 y.o.   MRN: [id number]   Chief Complaint  Patient presents with   Ascending aorta dilatation   Follow-up   Chest Pain    HPI  67 y.o. Caucasian female with hypertension, type 2 DM, mixed hyperlipidemia, aortic atherosclerosis, ascending aorta aneurysm  I last saw the patient in 10/2019. She denies exertional chest pain, shortness of breath, palpitations, leg edema, orthopnea, PND, TIA/syncope. She has burning in chest at rest,lasting only for a few seconds. Reviewed recent test results with the patient, details below. She has tried three different statins in the past, all caused severe myalgia symptoms. She also has fatty liver.  Past Medical History:  Diagnosis Date   Anxiety    Aortic atherosclerosis (HCC)    Depression    Dermatitis    due to plants (poison ivy, sumac, oak)   Diabetes (HCC)    Fatty liver disease, nonalcoholic    Pt is under care of specialist   High blood pressure    High cholesterol    IBS (irritable bowel syndrome)    Low back pain    Plantar fasciitis    Varicose veins of left lower extremity    Vitamin D deficiency      Past Surgical History:  Procedure Laterality Date   CARPAL TUNNEL RELEASE  2020   cataracts Bilateral    DILATION AND CURETTAGE OF UTERUS     ELBOW SURGERY     hemeroidectomy       Social History   Tobacco Use  Smoking Status Never  Smokeless Tobacco Never    Social History   Substance and Sexual Activity  Alcohol Use No   Comment: quit 2000     Family History  Problem Relation Age of Onset   Cancer Mother    Diabetes Mother    Alcohol abuse Father    Heart disease Father    High Cholesterol Father    Hypertension Father    Personality disorder Sister    Drug abuse Sister    Diabetes Sister    Alcohol abuse Brother    Colon cancer Neg Hx    Rectal cancer Neg Hx    Stomach cancer  Neg Hx       Current Outpatient Medications:    atenolol-chlorthalidone (TENORETIC) 50-25 MG tablet, Take 0.5 tablets by mouth daily., Disp: , Rfl:    ergocalciferol (VITAMIN D2) 50000 units capsule, Take 50,000 Units by mouth once a week., Disp: , Rfl:    metFORMIN (GLUCOPHAGE) 500 MG tablet, Take 500 mg by mouth 2 (two) times daily., Disp: , Rfl:    Multiple Vitamin (MULTIVITAMIN) tablet, Take 1 tablet by mouth daily., Disp: , Rfl:    PARoxetine (PAXIL) 20 MG tablet, Take 1 tablet (20 mg total) by mouth daily., Disp: 30 tablet, Rfl: 3   potassium chloride (MICRO-K) 10 MEQ CR capsule, Take 1 capsule twice a day by oral route for 10 days., Disp: , Rfl:    furosemide (LASIX) 20 MG tablet, TAKE 1 TABLET BY MOUTH EVERY DAY (Patient not taking: Reported on 11/23/2022), Disp: 90 tablet, Rfl: 1   Cardiovascular and other pertinent studies:  Reviewed external labs and tests, independently interpreted  EKG 11/23/2022: Sinus rhythm 70 bpm LVH ST-T changes likely due to LVH   CT cardiac scoring 09/25/2019: LM: 0 LAD: 9 (50th-75th percentile) LCx: 0 RCA: 0  The ascending thoracic aorta measures 4.3 x 3.9 cm. Minimally enlarged left axillary lymph node which is nonspecific but could be reactive.     Lexiscan (Walking with Camelia Phenes) Sestamibi Stress Test 09/25/2019: Nondiagnostic ECG stress. Very mild breast attenuation noted in the inferior wall. Myocardial perfusion is normal. Stress LV EF: 62%.  No previous exam available for comparison. Low risk.    EKG 09/22/2019: Sinus rhythm 62 bpm. Anterolateral T wave inversion, consider ischemia.     Outside echocardiogram 08/28/2019: LVEF rhythm 75%.  Mild increase in the thickness.  Normal diastolic function. Mild tricuspid regurgitaion.  Estimated RVSP 20-30 mmHg.   Recent labs: 08/13/2022: Glucose 117, BUN/Cr 20/0.6. EGFR 98. K 3.7. Hb 14.6. HbA1C 6.3% Chol 290, TG 323, HDL 48, LDL 179 TSH 2.6 normal   Review of Systems   Cardiovascular:  Negative for chest pain, dyspnea on exertion, leg swelling, palpitations and syncope.         Vitals:   11/23/22 1351  BP: 127/79  Pulse: 75  SpO2: 98%     Body mass index is 28.49 kg/m. Filed Weights   11/23/22 1351  Weight: 166 lb (75.3 kg)     Objective:   Physical Exam Vitals and nursing note reviewed.  Constitutional:      General: She is not in acute distress. Neck:     Vascular: No JVD.  Cardiovascular:     Rate and Rhythm: Normal rate and regular rhythm.     Heart sounds: Normal heart sounds. No murmur heard. Pulmonary:     Effort: Pulmonary effort is normal.     Breath sounds: Normal breath sounds. No wheezing or rales.  Musculoskeletal:     Right lower leg: No edema.     Left lower leg: No edema.           Visit diagnoses:   ICD-10-CM   1. Ascending aorta dilatation (HCC)  I77.810 EKG 12-Lead    Basic metabolic panel    CT ANGIO CHEST AORTA W/CM & OR WO/CM    PCV ECHOCARDIOGRAM COMPLETE    2. Elevated coronary artery calcium score  R93.1     3. Mixed hyperlipidemia  E78.2 Lipid panel    Lipid panel    Evolocumab SOAJ 140 mg    4. Essential hypertension  I10 PCV ECHOCARDIOGRAM COMPLETE    5. Statin myopathy  G72.0    T46.6X5A        Orders Placed This Encounter  Procedures   CT ANGIO CHEST AORTA W/CM & OR WO/CM   Basic metabolic panel   Lipid panel   EKG 12-Lead   PCV ECHOCARDIOGRAM COMPLETE     Medication changes this visit: Meds ordered this encounter  Medications   Evolocumab (REPATHA SURECLICK) 140 MG/ML SOAJ    Sig: Inject 140 mg into the skin every 14 (fourteen) days.    Dispense:  6 mL    Refill:  5   Evolocumab SOAJ 140 mg     Assessment & Recommendations:   67 y.o. Caucasian female with hypertension, type 2 DM, mixed hyperlipidemia, aortic atherosclerosis, ascending aorta aneurysm  Ascending aorta aneurysm: Last non contrast CT in 2021. Recommend echocardiogram and CTA aorta. HR, BP  controlled, on atenolol.  Mixed hyperlipidemia: Chol 290, TG 323, HDL 48, LDL 179 (08/2022). Statin myopathy. Recommend Repatha. Repeat lipid panel in 3 months  F/u in 3 months   Thank you for referring the patient to [place]. Please feel free to contact with any questions.  Elder Negus, MD Pager: [phone] Office: [phone]

## 2022-12-01 ENCOUNTER — Other Ambulatory Visit: Payer: Self-pay

## 2022-12-01 MED ORDER — REPATHA SURECLICK 140 MG/ML ~~LOC~~ SOAJ: 140.0000 mg | SUBCUTANEOUS | 5 refills | Status: DC

## 2022-12-15 ENCOUNTER — Other Ambulatory Visit: Payer: Medicare Other

## 2023-01-05 ENCOUNTER — Ambulatory Visit: Payer: Medicare Other

## 2023-01-05 DIAGNOSIS — I10 Essential (primary) hypertension: Secondary | ICD-10-CM

## 2023-01-05 DIAGNOSIS — I77810 Thoracic aortic ectasia: Secondary | ICD-10-CM

## 2023-03-22 ENCOUNTER — Ambulatory Visit: Payer: Medicare Other | Admitting: Cardiology

## 2023-04-07 ENCOUNTER — Ambulatory Visit: Payer: Medicare Other | Admitting: Cardiology

## 2023-05-15 ENCOUNTER — Encounter: Payer: Self-pay | Admitting: Internal Medicine

## 2023-05-18 ENCOUNTER — Ambulatory Visit (HOSPITAL_BASED_OUTPATIENT_CLINIC_OR_DEPARTMENT_OTHER): Payer: Medicare Other | Admitting: Family

## 2023-05-18 ENCOUNTER — Encounter (HOSPITAL_COMMUNITY): Payer: Self-pay | Admitting: Family

## 2023-05-18 ENCOUNTER — Other Ambulatory Visit (HOSPITAL_COMMUNITY): Payer: Self-pay | Admitting: Family

## 2023-05-18 ENCOUNTER — Other Ambulatory Visit: Payer: Self-pay

## 2023-05-18 DIAGNOSIS — F332 Major depressive disorder, recurrent severe without psychotic features: Secondary | ICD-10-CM

## 2023-05-18 DIAGNOSIS — F411 Generalized anxiety disorder: Secondary | ICD-10-CM

## 2023-05-18 MED ORDER — HYDROXYZINE HCL 10 MG PO TABS: 10.0000 mg | ORAL_TABLET | Freq: Every evening | ORAL | 0 refills | Status: DC | PRN

## 2023-05-18 MED ORDER — PAROXETINE HCL 20 MG PO TABS
40.0000 mg | ORAL_TABLET | Freq: Every day | ORAL | 1 refills | Status: DC
Start: 2023-05-18 — End: 2023-05-25

## 2023-05-18 NOTE — Progress Notes (Cosign Needed Addendum)
Psychiatric Initial Adult Assessment   Patient Identification: Sandra Willis MRN:  [id number] Date of Evaluation:  05/18/2023 Referral Source: Transfer from Sandra Kava, MD  Chief Complaint:  Depression and Overwhelming feelings Visit Diagnosis:    ICD-10-CM   1. Severe episode of recurrent major depressive disorder, without psychotic features (HCC)  F33.2 PARoxetine (PAXIL) 20 MG tablet    2. GAD (generalized anxiety disorder)  F41.1 PARoxetine (PAXIL) 20 MG tablet      History of Present Illness: Sandra Willis 67 year old female presents for transitions of care.  States she was followed by psychiatrist Sandra Willis since 2017.  She states she carries a diagnosis related to major depressive disorder, generalized anxiety disorder.  States she has been struggling with the symptoms since her early 33s.  She reports she has been taking Paxil 20 mg for the past 6 years however feels his medications has just stopped working.  States her primary care provider had recently increased her dose from 20 mg to 30 mg 2 months ago she does not noticing any symptom relief.  States she wakes up feeling overwhelmed, low motivation, sadness, memory issues and increased anxiety.  She denied illicit drug use or substance abuse history.  Sandra Willis reports she been married for the past 48 years.  States her husband is supportive. Stated that she has 2 adult children and 4 grandchildren.  States she has "a great life" sometimes life gets overwhelming.  States struggling with multiple psychosocial stressors related to symptoms of worry.  States her daughter who is 84 years old recently gave birth, however she was concerned about her and her pregnancy.  States she was also concerned about the presidential election and states she has not been sleeping well at night.  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   Corinne denied previous suicidal attempt.  Denied previous inpatient  admissions.  Denied that she is currently followed by psychiatry services.  Denied illicit drug use.  Denied history related to physical sexual abuse. Reports diagnoses related to anxiety, arthritis, diabetes, hypertension and elevated cholesterol.  Reports history related to fatty liver disease: PHQ-9 8 GAD-7 8 in the office  Major depressive: Discussed increasing Paxil 30 mg to 40 mg to maximize dose, will follow-up with adjunct therapy and/or downward titration from Paxil to venlafaxine and/or Wellbutrin at follow-up appointment Initiated hydroxyzine 10 mg p.o. nightly as needed -Follow-up 3 weeks virtual  During evaluation Sandra Willis is sitting; she is alert/oriented x 4; calm/cooperative; and mood congruent with affect.  Patient is speaking in a clear tone at moderate volume, and normal pace; with good eye contact.  Her thought process is coherent and relevant; There is no indication that she is currently responding to internal/external stimuli or experiencing delusional thought content.  Patient denies suicidal/self-harm/homicidal ideation, psychosis, and paranoia.  Patient has remained calm throughout assessment and has answered questions appropriately.     Associated Signs/Symptoms: Depression Symptoms:  depressed mood, difficulty concentrating, anxiety, (Hypo) Manic Symptoms:  Irritable Mood, Anxiety Symptoms:  Excessive Worry, Psychotic Symptoms:  Hallucinations: None PTSD Symptoms: NA  Past Psychiatric History:   Previous Psychotropic Medications: Yes   Substance Abuse History in the last 12 months:  No.  Consequences of Substance Abuse: NA  Past Medical History:  Past Medical History:  Diagnosis Date   Anxiety    Aortic atherosclerosis (HCC)    Depression    Dermatitis    due to plants (poison ivy, sumac, oak)  Diabetes (HCC)    Fatty liver disease, nonalcoholic    Pt is under care of specialist   High blood pressure    High cholesterol    IBS (irritable  bowel syndrome)    Low back pain    Plantar fasciitis    Varicose veins of left lower extremity    Vitamin D deficiency     Past Surgical History:  Procedure Laterality Date   CARPAL TUNNEL RELEASE  2020   cataracts Bilateral    DILATION AND CURETTAGE OF UTERUS     ELBOW SURGERY     hemeroidectomy      Family Psychiatric History:   Family History:  Family History  Problem Relation Age of Onset   Cancer Mother    Diabetes Mother    Alcohol abuse Father    Heart disease Father    High Cholesterol Father    Hypertension Father    Personality disorder Sister    Drug abuse Sister    Diabetes Sister    Alcohol abuse Brother    Colon cancer Neg Hx    Rectal cancer Neg Hx    Stomach cancer Neg Hx     Social History:   Social History   Socioeconomic History   Marital status: Married    Spouse name: Not on file   Number of children: 2   Years of education: Not on file   Highest education level: Not on file  Occupational History   Not on file  Tobacco Use   Smoking status: Never   Smokeless tobacco: Never  Vaping Use   Vaping status: Never Used  Substance and Sexual Activity   Alcohol use: No    Comment: quit 2000   Drug use: No   Sexual activity: Yes    Partners: Male  Other Topics Concern   Not on file  Social History Narrative   Pt lives in [place] with her husband. Pt has 2 adult children and has been married 41 yrs. They got married on her 30th birthday. Born and raised in [place] by her parents. Pt has one older brother and one older sister. 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 Caffeine 2 cups coffee on sat/ sun.  Sodas 1-2 daily during week.   Education 12th grade.    Social Determinants of Health   Financial Resource Strain: Not on file  Food Insecurity: No Food Insecurity (09/25/2021)   [facility], [facility]   Hunger Vital Sign    Worried About Running Out of Food in the Last Year: Never true    Ran Out of  Food in the Last Year: Never true  Transportation Needs: No Transportation Needs (09/25/2021)   Received from [facility], [facility]   [facility] - Transportation    Lack of Transportation (Medical): No    Lack of Transportation (Non-Medical): No  Physical Activity: Not on file  Stress: Not on file  Social Connections: Unknown (11/18/2021)   Received from [facility], [facility]   Social Network    Social Network: Not on file    Additional Social History:   Allergies:   Allergies  Allergen Reactions   Fenofibrate     Aching   Statins Other (See Comments)    Diffuse pain, Simvastatin   Bactrim [Sulfamethoxazole-Trimethoprim] Rash   Sulfa Antibiotics Rash    bactrim   Zithromax [Azithromycin] Rash    Metabolic Disorder Labs: No results found  for: "HGBA1C", "MPG" No results found for: "PROLACTIN" No results found for: "CHOL", "TRIG", "HDL", "CHOLHDL", "VLDL", "LDLCALC" No results found for: "TSH"  Therapeutic Level Labs: No results found for: "LITHIUM" No results found for: "CBMZ" No results found for: "VALPROATE"  Current Medications: Current Outpatient Medications  Medication Sig Dispense Refill   atenolol-chlorthalidone (TENORETIC) 50-25 MG tablet Take 0.5 tablets by mouth daily.     ergocalciferol (VITAMIN D2) 50000 units capsule Take 50,000 Units by mouth once a week.     Evolocumab (REPATHA SURECLICK) 140 MG/ML SOAJ Inject 140 mg into the skin every 14 (fourteen) days. 6 mL 5   hydrOXYzine (ATARAX) 10 MG tablet Take 1 tablet (10 mg total) by mouth at bedtime and may repeat dose one time if needed. 30 tablet 0   metFORMIN (GLUCOPHAGE) 500 MG tablet Take 500 mg by mouth 2 (two) times daily.     Multiple Vitamin (MULTIVITAMIN) tablet Take 1 tablet by mouth daily.     potassium chloride (MICRO-K) 10 MEQ CR capsule Take 1 capsule twice a day by oral route for 10 days.     furosemide (LASIX) 20 MG tablet TAKE 1 TABLET BY MOUTH EVERY DAY (Patient not taking:  Reported on 11/23/2022) 90 tablet 1   PARoxetine (PAXIL) 20 MG tablet Take 2 tablets (40 mg total) by mouth daily. 30 tablet 1   No current facility-administered medications for this visit.    Musculoskeletal: Strength & Muscle Tone: within normal limits Gait & Station: normal Patient leans: N/A  Psychiatric Specialty Exam: Review of Systems  Psychiatric/Behavioral:  Positive for sleep disturbance. Negative for decreased concentration and self-injury. The patient is nervous/anxious.   All other systems reviewed and are negative.   Blood pressure (!) 135/90, pulse 77, height 5\' 3"  (1.6 m), weight 162 lb (73.5 kg).Body mass index is 28.7 kg/m.  General Appearance: Casual  Eye Contact:  Good  Speech:  Clear and Coherent  Volume:  Normal  Mood:  Anxious and Depressed  Affect:  Congruent  Thought Process:  Coherent  Orientation:  Full (Time, Place, and Person)  Thought Content:  Logical  Suicidal Thoughts:  No  Homicidal Thoughts:  No  Memory:  Immediate;   Good Recent;   Good  Judgement:  Fair  Insight:  Good  Psychomotor Activity:  Normal  Concentration:  Concentration: Good  Recall:  Good  Fund of Knowledge:Good  Language: Good  Akathisia:  No  Handed:  Right  AIMS (if indicated):  not done  Assets:  Communication Skills Desire for Improvement Social Support  ADL's:  Intact  Cognition: WNL  Sleep:  Good   Screenings: PHQ2-9    Flowsheet Row Office Visit [facility]/18/20[facility]  PHQ-2 Total Score 3  PHQ-9 Total Score 8       Assessment and Plan: Franziska Strength 67 year old Caucasian female presents to establish care.  Reports she was previously followed by Dr. Michae Willis since 2017.  States she has been taking Paxil for the past 5 to 6 years.  Denying any symptoms related to medications.  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  Reports recent stressors has of 6 months prior  feeling overwhelmed related to presidential election stated that her 82 year old daughter was pregnant with her fourth grandchild.  Reports symptoms of worry related to daughter giving birth.  No concerns related to illicit substance abuse.  Major depressive: Generalized anxiety disorder:  Discussed increasing Paxil 30  mg to 40 mg to maximize dose, will follow-up with adjunct therapy and/or downward titration from Paxil to venlafaxine and/or Wellbutrin at follow-up appointment Initiated hydroxyzine 10 mg p.o. nightly as needed -Follow-up 3 weeks virtual  Collaboration of Care: Medication Management AEB increased Paxil 20 mg 40 mg daily  Patient/Guardian was advised Release of Information must be obtained prior to any record release in order to collaborate their care with an outside provider. Patient/Guardian was advised if they have not already done so to contact the registration department to sign all necessary forms in order for [place] to release information regarding their care.   Consent: Patient/Guardian gives verbal consent for treatment and assignment of benefits for services provided during this visit. Patient/Guardian expressed understanding and agreed to proceed.   Oneta Rack, NP 11/12/20243:15 PM

## 2023-05-25 ENCOUNTER — Other Ambulatory Visit (HOSPITAL_COMMUNITY): Payer: Self-pay

## 2023-05-25 MED ORDER — PAROXETINE HCL 40 MG PO TABS: 40.0000 mg | ORAL_TABLET | ORAL | 0 refills | Status: DC

## 2023-06-01 ENCOUNTER — Encounter: Payer: Self-pay | Admitting: Cardiology

## 2023-06-01 ENCOUNTER — Ambulatory Visit: Payer: Medicare Other | Attending: Cardiology | Admitting: Cardiology

## 2023-06-01 VITALS — BP 118/78 | HR 70 | Resp 16 | Ht 63.0 in | Wt 167.0 lb

## 2023-06-01 DIAGNOSIS — I7121 Aneurysm of the ascending aorta, without rupture: Secondary | ICD-10-CM

## 2023-06-01 DIAGNOSIS — I77810 Thoracic aortic ectasia: Secondary | ICD-10-CM

## 2023-06-01 DIAGNOSIS — E782 Mixed hyperlipidemia: Secondary | ICD-10-CM | POA: Diagnosis not present

## 2023-06-01 NOTE — Patient Instructions (Signed)
Medication Instructions:   STOP TAKING LASIX  *If you need a refill on your cardiac medications before your next appointment, please call your pharmacy*   Lab Work:  TODAY--LIPIDS  If you have labs (blood work) drawn today and your tests are completely normal, you will receive your results only by: MyChart Message (if you have MyChart) OR A paper copy in the mail If you have any lab test that is abnormal or we need to change your treatment, we will call you to review the results.   Testing/Procedures:  Your physician has requested that you have an echocardiogram. Echocardiography is a painless test that uses sound waves to create images of your heart. It provides your doctor with information about the size and shape of your heart and how well your heart's chambers and valves are working. This procedure takes approximately one hour. There are no restrictions for this procedure.  ECHO IN ONE YEAR PER DR. PATWARDHAN   Please do NOT wear cologne, perfume, aftershave, or lotions (deodorant is allowed). Please arrive 15 minutes prior to your appointment time.  Please note: We ask at that you not bring children with you during ultrasound (echo/ vascular) testing. Due to room size and safety concerns, children are not allowed in the ultrasound rooms during exams. Our front office staff cannot provide observation of children in our lobby area while testing is being conducted. An adult accompanying a patient to their appointment will only be allowed in the ultrasound room at the discretion of the ultrasound technician under special circumstances. We apologize for any inconvenience.    Follow-Up: At [facility], you and your health needs are our priority.  As part of our continuing mission to provide you with exceptional heart care, we have created designated Provider Care Teams.  These Care Teams include your primary Cardiologist (physician) and Advanced Practice Providers (APPs -   Physician Assistants and Nurse Practitioners) who all work together to provide you with the care you need, when you need it.  We recommend signing up for the patient portal called "MyChart".  Sign up information is provided on this After Visit Summary.  MyChart is used to connect with patients for Virtual Visits (Telemedicine).  Patients are able to view lab/test results, encounter notes, upcoming appointments, etc.  Non-urgent messages can be sent to your provider as well.   To learn more about what you can do with MyChart, go to [email or web address].    Your next appointment:   1 year(s)  Provider:   DR. Rosemary Holms

## 2023-06-01 NOTE — Progress Notes (Signed)
  Cardiology Office Note:  .   Date:  06/01/2023  ID:  [name], DOB 02/14/1956, MRN [id number] PCP: Ignatius Specking, MD  [facility] Providers Cardiologist:  Truett Mainland, MD PCP: Ignatius Specking, MD  Chief Complaint  Patient presents with   Ascending aorta dilatation   Follow-up      History of Present Illness: Sandra Willis is a 67 y.o. female with hypertension, type 2 DM, mixed hyperlipidemia, aortic atherosclerosis, ascending aorta aneurysm  Patient is doing well from cardiac standpoint, has no complaints.  She is having recurrent bloating sensation.  She has an upcoming appointment with PCP to discuss the same.  She is taking Repatha regularly.  Reviewed recent echocardiogram and CT scan results with the patient, details below.    Vitals:   06/01/23 1343  BP: 118/78  Pulse: 70  Resp: 16  SpO2: 98%     ROS:  Review of Systems  Cardiovascular:  Negative for chest pain, dyspnea on exertion, leg swelling, palpitations and syncope.  Gastrointestinal:  Positive for bloating and diarrhea.     Studies Reviewed: .        Echocardiogram 01/05/2023: Left ventricle cavity is normal in size. Mild concentric hypertrophy of the left ventricle. Normal global wall motion. Normal LV systolic function with EF 64%. Doppler evidence of grade I (impaired) diastolic dysfunction, normal LAP. Mildly dilated ascending aorta 3.8 cm. Left atrial cavity is mildly dilated. Mild mitral valve leaflet thickening. Mild (Grade I) mitral regurgitation. Mild tricuspid regurgitation. No evidence of pulmonary hypertension.  CTA aorta 11/23/2022: 1. Stable uncomplicated fusiform ectasia of the ascending thoracic aorta measuring 38 mm in diameter, unchanged compared to the 11/2020 examination. 2. Scattered atherosclerotic plaque within the thoracic aorta. Aortic Atherosclerosis (ICD10-I70.0). 3. Redemonstrated hepatic steatosis and previously characterized hepatic  hemangiomas, better evaluated on abdominal MRI performed 09/02/2021 and 12/14/2020.     Physical Exam:   Physical Exam Vitals and nursing note reviewed.  Constitutional:      General: She is not in acute distress. Neck:     Vascular: No JVD.  Cardiovascular:     Rate and Rhythm: Normal rate and regular rhythm.     Heart sounds: Normal heart sounds. No murmur heard. Pulmonary:     Effort: Pulmonary effort is normal.     Breath sounds: Normal breath sounds. No wheezing or rales.  Musculoskeletal:     Right lower leg: No edema.     Left lower leg: No edema.      VISIT DIAGNOSES:   ICD-10-CM   1. Ascending aorta dilatation (HCC)  I77.810     2. Mixed hyperlipidemia  E78.2        ASSESSMENT AND PLAN: .    Sandra Willis is a 67 y.o. female with hypertension, type 2 DM, mixed hyperlipidemia, aortic atherosclerosis, ascending aorta aneurysm   Ascending aorta aneurysm: Stable fusiform aneurysm of ascending aorta, measuring 3.8 cm on both CTA and echocardiogram. We can monitor this with annual echocardiogram and repeat CT scan if there is any change in the aorta dimension. HR, BP controlled, on atenolol.   Mixed hyperlipidemia: Chol 290, TG 323, HDL 48, LDL 179 (08/2022). Known h/o statin myopathy. Continue Repatha. Check lipid panel.   Patient will follow-up soon with PCP regarding diarrhea complaints        F/u in 1 year  Signed, Elder Negus, MD

## 2023-06-02 LAB — LIPID PANEL
Chol/HDL Ratio: 2.1 {ratio} (ref 0.0–4.4)
Cholesterol, Total: 109 mg/dL (ref 100–199)
HDL: 51 mg/dL (ref >39)
LDL Chol Calc (NIH): 28 mg/dL (ref 0–99)
Triglycerides: 188 mg/dL — ABNORMAL HIGH (ref 0–149)
VLDL Cholesterol Cal: 30 mg/dL (ref 5–40)

## 2023-06-08 ENCOUNTER — Telehealth (HOSPITAL_COMMUNITY): Payer: Medicare Other | Admitting: Family

## 2023-06-08 DIAGNOSIS — F411 Generalized anxiety disorder: Secondary | ICD-10-CM

## 2023-06-08 DIAGNOSIS — F332 Major depressive disorder, recurrent severe without psychotic features: Secondary | ICD-10-CM | POA: Diagnosis not present

## 2023-06-08 MED ORDER — PAROXETINE HCL 40 MG PO TABS: 40.0000 mg | ORAL_TABLET | ORAL | 1 refills | Status: DC

## 2023-06-08 NOTE — Progress Notes (Signed)
Virtual Visit via Video Note  I connected with Sandra Willis on 06/08/23 at  2:30 PM EST by a video enabled telemedicine application and verified that I am speaking with the correct person using two identifiers.  Location: Patient: Home Provider: Office   I discussed the limitations of evaluation and management by telemedicine and the availability of in person appointments. The patient expressed understanding and agreed to proceed.   I discussed the assessment and treatment plan with the patient. The patient was provided an opportunity to ask questions and all were answered. The patient agreed with the plan and demonstrated an understanding of the instructions.   The patient was advised to call back or seek an in-person evaluation if the symptoms worsen or if the condition fails to improve as anticipated.  I provided 00 minutes of non-face-to-face time during this encounter.   Sandra Rack, NP   BH MD/PA/NP OP Progress Note  06/08/2023 2:27 PM Sandra Willis  MRN:  [id number]  Chief Complaint: Sandra Willis stated " I am feeling a lot better than what I did."   HPI: Sandra Willis 67 year old female was seen and evaluated via video teleassessment.  She reports overall her mood has improved with medication adjustment.  Per previous visit discussed titrating Paxil 30 mg to 40 mg daily.  She reports she has been taking and tolerating medications well.  Patient was initiated on hydroxyzine 10 mg p.o. nightly however states the medication was too sedating and she has not taken it since.  States she is able to get sleep on her own.  States her mood has stabilized.  States her symptoms of worry was related to the presidential election.  States" everything is fine, worked out the way it supposed to."  No depressive symptoms noted.  Continues to state intermittent anxiety and ruminations however, is able to manage her emotions.  Discussed making medications refills x 90 days.  Reports a good appetite.   States she is resting well throughout the night.  Support, encouragement and reassurance was provided.   Major depressive disorder: Generalized anxiety disorder:  Refilled Paxil 40 mg daily Discontinue hydroxyzine 10 mg p.o. 3 times daily as needed  Sandra Willis is sitting on her couch;she is alert/oriented x 4; calm/cooperative; and mood congruent with affect.  Rating her depression and anxiety 3 out of 10 with 10 being the worst.  Patient is speaking in a clear tone at moderate volume, and normal pace; with good eye contact. Her thought process is coherent and relevant; There is no indication that she is currently responding to internal/external stimuli or experiencing delusional thought content.  Patient denies suicidal/self-harm/homicidal ideation, psychosis, and paranoia.  Patient has remained calm throughout assessment and has answered questions appropriately.    Visit Diagnosis:    ICD-10-CM   1. Severe episode of recurrent major depressive disorder, without psychotic features (HCC)  F33.2     2. GAD (generalized anxiety disorder)  F41.1       Past Psychiatric History:   Past Medical History:  Past Medical History:  Diagnosis Date   Anxiety    Aortic atherosclerosis (HCC)    Depression    Dermatitis    due to plants (poison ivy, sumac, oak)   Diabetes (HCC)    Fatty liver disease, nonalcoholic    Pt is under care of specialist   High blood pressure    High cholesterol    IBS (irritable bowel syndrome)    Low back pain  Plantar fasciitis    Varicose veins of left lower extremity    Vitamin D deficiency     Past Surgical History:  Procedure Laterality Date   CARPAL TUNNEL RELEASE  2020   cataracts Bilateral    DILATION AND CURETTAGE OF UTERUS     ELBOW SURGERY     hemeroidectomy      Family Psychiatric History:   Family History:  Family History  Problem Relation Age of Onset   Cancer Mother    Diabetes Mother    Alcohol abuse Father    Heart disease  Father    High Cholesterol Father    Hypertension Father    Personality disorder Sister    Drug abuse Sister    Diabetes Sister    Alcohol abuse Brother    Colon cancer Neg Hx    Rectal cancer Neg Hx    Stomach cancer Neg Hx     Social History:  Social History   Socioeconomic History   Marital status: Married    Spouse name: Not on file   Number of children: 2   Years of education: Not on file   Highest education level: Not on file  Occupational History   Not on file  Tobacco Use   Smoking status: Never   Smokeless tobacco: Never  Vaping Use   Vaping status: Never Used  Substance and Sexual Activity   Alcohol use: No    Comment: quit 2000   Drug use: No   Sexual activity: Yes    Partners: Male  Other Topics Concern   Not on file  Social History Narrative   Pt lives in [place] with her husband. Pt has 2 adult children and has been married 41 yrs. They got married on her 28th birthday. Born and raised in [place] by her parents. Pt has one older brother and one older sister. 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 Caffeine 2 cups coffee on sat/ sun.  Sodas 1-2 daily during week.   Education 12th grade.    Social Determinants of Health   Financial Resource Strain: Not on file  Food Insecurity: No Food Insecurity (09/25/2021)   Received from [facility], [facility]   Hunger Vital Sign    Worried About Running Out of Food in the Last Year: Never true    Ran Out of Food in the Last Year: Never true  Transportation Needs: No Transportation Needs (09/25/2021)   Received from [facility], [facility]   [facility] - Transportation    Lack of Transportation (Medical): No    Lack of Transportation (Non-Medical): No  Physical Activity: Not on file  Stress: Not on file  Social Connections: Unknown (11/18/2021)   Received from [facility], [facility]   Social Network    Social Network: Not on file    Allergies:  Allergies  Allergen Reactions    Fenofibrate     Aching   Statins Other (See Comments)    Diffuse pain, Simvastatin   Bactrim [Sulfamethoxazole-Trimethoprim] Rash   Sulfa Antibiotics Rash    bactrim   Zithromax [Azithromycin] Rash    Metabolic Disorder Labs: No results found for: "HGBA1C", "MPG" No results found for: "PROLACTIN" Lab Results  Component Value Date   CHOL 109 06/01/2023   TRIG 188 (H) 06/01/2023   HDL 51 06/01/2023   CHOLHDL 2.1 06/01/2023   LDLCALC 28 06/01/2023   No results found for: "TSH"  Therapeutic Level Labs:  No results found for: "LITHIUM" No results found for: "VALPROATE" No results found for: "CBMZ"  Current Medications: Current Outpatient Medications  Medication Sig Dispense Refill   atenolol-chlorthalidone (TENORETIC) 50-25 MG tablet Take 0.5 tablets by mouth daily.     ergocalciferol (VITAMIN D2) 50000 units capsule Take 50,000 Units by mouth once a week.     Evolocumab (REPATHA SURECLICK) 140 MG/ML SOAJ Inject 140 mg into the skin every 14 (fourteen) days. 6 mL 5   metFORMIN (GLUCOPHAGE) 500 MG tablet Take 500 mg by mouth 2 (two) times daily.     Multiple Vitamin (MULTIVITAMIN) tablet Take 1 tablet by mouth daily.     PARoxetine (PAXIL) 40 MG tablet Take 1 tablet (40 mg total) by mouth every morning. 90 tablet 1   potassium chloride (MICRO-K) 10 MEQ CR capsule Take 1 capsule twice a day by oral route for 10 days.     No current facility-administered medications for this visit.     Musculoskeletal: Video teleassessment  Psychiatric Specialty Exam: Review of Systems  There were no vitals taken for this visit.There is no height or weight on file to calculate BMI.  General Appearance: Casual  Eye Contact:  Good  Speech:  Clear and Coherent  Volume:  Normal  Mood:  Euthymic  Affect:  Congruent  Thought Process:  Coherent  Orientation:  Full (Time, Place, and Person)  Thought Content: Logical   Suicidal Thoughts:  No  Homicidal Thoughts:  No  Memory:  Immediate;    Good Recent;   Good  Judgement:  Good  Insight:  Good  Psychomotor Activity:  Normal  Concentration:  Concentration: Good  Recall:  Fair  Fund of Knowledge: Good  Language: Good  Akathisia:  No  Handed:  Right  AIMS (if indicated): not done  Assets:  Communication Skills Desire for Improvement Resilience Social Support  ADL's:  Intact  Cognition: WNL  Sleep:  Fair   Screenings: PHQ2-9    Flowsheet Row Office Visit from [facility]/18/2023 [facility]  PHQ-2 Total Score 3  PHQ-9 Total Score 8        Assessment and Plan: Sandra Willis 67 year old female presents for medication management appointment.  Patient is currently prescribed Paxil 40 mg daily.  She reports she has noticed improvement with her mood depression and anxiety.  He is denying any medication side effects.  Discussed discontinuing hydroxyzine as she states struggling with a lingering sedation affect.  No concerns related to suicidal or homicidal ideations.  Discussed following up 3 months for medication management.  Denied that she is followed by therapy services at this time.  Consideration for follow-up with therapy for additional support.  Patient appeared amendable. Support encouragement reassurance was provided.  Major depressive disorder: Generalized anxiety disorder:  Refilled Paxil 40 mg daily Discontinue hydroxyzine 10 mg p.o. 3 times daily as needed Patient to follow-up 3 months for medication management appointment  Collaboration of Care: Collaboration of Care: Medication Management AEB medications refilled  Patient/Guardian was advised Release of Information must be obtained prior to any record release in order to collaborate their care with an outside provider. Patient/Guardian was advised if they have not already done so to contact the registration department to sign all necessary forms in order for [place] to release information regarding their care.   Consent:  Patient/Guardian gives verbal consent for treatment and assignment of benefits for services provided during this visit. Patient/Guardian expressed understanding and agreed to proceed.    Jerene Pitch  Melvyn Neth, NP 06/08/2023, 2:27 PM

## 2023-06-15 ENCOUNTER — Other Ambulatory Visit: Payer: Self-pay | Admitting: Internal Medicine

## 2023-06-15 ENCOUNTER — Ambulatory Visit
Admission: RE | Admit: 2023-06-15 | Discharge: 2023-06-15 | Disposition: A | Discharge status: Home / Self Care | Payer: Medicare Other | Source: Ambulatory Visit | Attending: Internal Medicine | Admitting: Internal Medicine

## 2023-06-15 DIAGNOSIS — Z1231 Encounter for screening mammogram for malignant neoplasm of breast: Secondary | ICD-10-CM

## 2023-06-16 LAB — LAB REPORT - SCANNED: EGFR: 85

## 2023-08-13 ENCOUNTER — Other Ambulatory Visit (INDEPENDENT_AMBULATORY_CARE_PROVIDER_SITE_OTHER): Payer: Medicare Other

## 2023-08-13 ENCOUNTER — Encounter: Payer: Self-pay | Admitting: Nurse Practitioner

## 2023-08-13 ENCOUNTER — Ambulatory Visit (INDEPENDENT_AMBULATORY_CARE_PROVIDER_SITE_OTHER): Payer: Medicare Other | Admitting: Nurse Practitioner

## 2023-08-13 VITALS — BP 128/78 | HR 76 | Ht 63.0 in | Wt 163.1 lb

## 2023-08-13 DIAGNOSIS — D1803 Hemangioma of intra-abdominal structures: Secondary | ICD-10-CM

## 2023-08-13 DIAGNOSIS — R197 Diarrhea, unspecified: Secondary | ICD-10-CM | POA: Diagnosis not present

## 2023-08-13 DIAGNOSIS — R110 Nausea: Secondary | ICD-10-CM

## 2023-08-13 DIAGNOSIS — R112 Nausea with vomiting, unspecified: Secondary | ICD-10-CM

## 2023-08-13 DIAGNOSIS — R7989 Other specified abnormal findings of blood chemistry: Secondary | ICD-10-CM

## 2023-08-13 DIAGNOSIS — K760 Fatty (change of) liver, not elsewhere classified: Secondary | ICD-10-CM | POA: Diagnosis not present

## 2023-08-13 LAB — COMPREHENSIVE METABOLIC PANEL
ALT: 96 U/L — ABNORMAL HIGH (ref 0–35)
AST: 78 U/L — ABNORMAL HIGH (ref 0–37)
Albumin: 4.7 g/dL (ref 3.5–5.2)
Alkaline Phosphatase: 69 U/L (ref 39–117)
BUN: 12 mg/dL (ref 6–23)
CO2: 30 meq/L (ref 19–32)
Calcium: 9.8 mg/dL (ref 8.4–10.5)
Chloride: 100 meq/L (ref 96–112)
Creatinine, Ser: 0.68 mg/dL (ref 0.40–1.20)
GFR: 90.9 mL/min (ref >60.00)
Glucose, Bld: 113 mg/dL — ABNORMAL HIGH (ref 70–99)
Potassium: 3.2 meq/L — ABNORMAL LOW (ref 3.5–5.1)
Sodium: 146 meq/L — ABNORMAL HIGH (ref 135–145)
Total Bilirubin: 0.4 mg/dL (ref 0.2–1.2)
Total Protein: 7.4 g/dL (ref 6.0–8.3)

## 2023-08-13 LAB — CBC
HCT: 44.8 % (ref 36.0–46.0)
Hemoglobin: 15.4 g/dL — ABNORMAL HIGH (ref 12.0–15.0)
MCHC: 34.5 g/dL (ref 30.0–36.0)
MCV: 92.6 fL (ref 78.0–100.0)
Platelets: 337 10*3/uL (ref 150.0–400.0)
RBC: 4.84 Mil/uL (ref 3.87–5.11)
RDW: 13.3 % (ref 11.5–15.5)
WBC: 7.4 10*3/uL (ref 4.0–10.5)

## 2023-08-13 LAB — LIPASE: Lipase: 59 U/L (ref 11.0–59.0)

## 2023-08-13 NOTE — Patient Instructions (Signed)
 You have been scheduled for an abdominal ultrasound with elastography at [facility] (1st floor). Your appointment is scheduled for 08/16/23 at 11:30 am. Please arrive 15-20 minutes prior to your scheduled appointment for registration purposes. Make certain not to have anything to eat or drink 6 hours prior to your procedure. Should you need to reschedule your appointment, you may contact radiology at [phone].  Your provider has requested that you go to the basement level for lab work before leaving today. Press B on the elevator. The lab is located at the first door on the left as you exit the elevator.  Follow up with your primary care physician regarding left rib cage pain & to consider switching Metformin to extended release formulation.  Due to recent changes in healthcare laws, you may see the results of your imaging and laboratory studies on MyChart before your provider has had a chance to review them.  We understand that in some cases there may be results that are confusing or concerning to you. Not all laboratory results come back in the same time frame and the provider may be waiting for multiple results in order to interpret others.  Please give us  48 hours in order for your provider to thoroughly review all the results before contacting the office for clarification of your results.   Thank you for trusting me with your gastrointestinal care!   Elida Shawl, CRNP

## 2023-08-13 NOTE — Progress Notes (Signed)
 08/13/2023 Sandra Willis [id number] 10-Jul-1955   Chief Complaint: Elevated LFTs  History of Present Illness: Sandra Willis is a 68 year old female with a past medical history of anxiety, depression, hypertensin, elevated coronary calcium score, ascending aorta aneurysms, DM type II and hepatic steatosis. She is followed by Dr. Federico.  She presents today as advised by her primary care provider for further evaluation regarding elevated LFTs, labs 05/2023 reportedly showed higher LFT levels when compared to prior labs.  Patient presents without 05/2023 lab results and I am unable to access these labs in epic or Care Everywhere.     Labs 08/2021: LFTs with elevated AST of 39 and ALT of 44. Hep B surface antigen negative. Hep B surface antibody negative. HCV Ab NR. Hepatitis A antibody NR. ANA, IgG, and ASMA normal. INR nml. Ferritin mildly elevated at 556.1.  Abdominal sonogram with elastography 09/02/2021 showed a median kPa 3.4 which indicated low probability of advanced liver disease.   Liver MRI diffuse 09/02/2021 showed diffuse hepatic steatosis with stable bilobar hemangioma.   Since endorsed receiving Hep A and Hep B vaccination series within the past year at the [facility] pharmacy.   She has intermittent nausea without vomiting.  Decreased appetite.  No abdominal pain.  She has intermittent sharp pain under the breast into the lower left rib cage which last for a few seconds then goes away and occurs 2-3 times daily for the past 3 weeks.  The pain under her left breast/rib cage occurs randomly and unrelated to eating or change of position.  No associated palpitations, dizziness or shortness of breath.  She has chronic loose stools which has worsened over the past few months.  If she goes 1 day without passing a bowel movement the next day she has abdominal cramping and passes multiple nonbloody loose stools.  No black stools.  She also passes a normal formed stool several days weekly.  She  has been on metformin for 3+ years, no recent change in dosage.  She has been on 4 courses of antibiotics for UTI since 06/2023 which included Cephalexin twice daily x 7 days, Cipro twice daily x 7 days x 2 courses and Cefuroxime twice daily x 7 days. She underwent a colonoscopy 10/06/2021 which showed diverticulosis throughout the colon and internal hemorrhoids. Recall colonoscopy due 10/2031. Weight stable, no recent weight loss or weight gain.  PAST IMAGE STUDIES:   CT Abd w/contrast 12/28/03: IMPRESSION  1. No evidence of upper urinary tract calculi.  2. Normal appearing kidneys.  3. At least three liver hemangiomas, the largest on the order of 6cm in the anterior segment of the right lobe. There is a hepatic cyst in the far lateral segment of the left lobe as well.   CTA chest/aorta 11/04/20: IMPRESSION: No acute finding.  Ectasia of the ascending aorta, with a maximum diameter 3.8 cm.  There is a new liver lesion incidentally imaged, at the caudal aspect of segment 4 B. this lesion is incompletely characterized, and is atypical configuration for a benign hemangioma given the exophytic nature. Referral for liver protocol are MRI is recommended, as hepatocellular carcinoma cannot be excluded  Similar appearance of liver hemangiomas, relatively unchanged dating to the CT 12/27/2003.  Aortic Atherosclerosis (ICD10-I70.0).   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 Additional cavernous hemangiomas noted within the liver parenchyma. 10 mm hypervascular lesion in the dome  of the liver cannot be definitively characterized but is likely benign and also probably a cavernous hemangioma. Follow-up MRI in 6 months could be used to ensure stability.   U/S Abd w/elastography 09/02/21: ULTRASOUND ABDOMEN: Hepatic steatosis with areas of focal fatty sparing along the falciform ligament, gallbladder fossa  and hepatic hilum. ULTRASOUND HEPATIC ELASTOGRAPHY: Median kPa:  3.4 Diagnostic category:  < or = 5 kPa: high probability of being normal   MR Liver w/contrast 09/02/21: IMPRESSION: 1. Stable bilobar hepatic hemangiomas, including the previously described probable hemangioma in the hepatic dome which was better evaluated on today's examination. 2. Diffuse hepatic steatosis with focal fatty sparing along the gallbladder fossa, falciform ligament and hepatic hilum.    Past Medical History:  Diagnosis Date   Anxiety    Aortic atherosclerosis (HCC)    Depression    Dermatitis    due to plants (poison ivy, sumac, oak)   Diabetes (HCC)    Fatty liver disease, nonalcoholic    Pt is under care of specialist   High blood pressure    High cholesterol    IBS (irritable bowel syndrome)    Low back pain    Plantar fasciitis    Varicose veins of left lower extremity    Vitamin D  deficiency    Past Surgical History:  Procedure Laterality Date   CARPAL TUNNEL RELEASE  2020   cataracts Bilateral    DILATION AND CURETTAGE OF UTERUS     ELBOW SURGERY     hemeroidectomy     Current Outpatient Medications on File Prior to Visit  Medication Sig Dispense Refill   atenolol-chlorthalidone (TENORETIC) 50-25 MG tablet Take 0.5 tablets by mouth daily.     ergocalciferol  (VITAMIN D2) 50000 units capsule Take 50,000 Units by mouth once a week.     Evolocumab  (REPATHA  SURECLICK) 140 MG/ML SOAJ Inject 140 mg into the skin every 14 (fourteen) days. 6 mL 5   metFORMIN (GLUCOPHAGE) 500 MG tablet Take 500 mg by mouth 2 (two) times daily.     Multiple Vitamin (MULTIVITAMIN) tablet Take 1 tablet by mouth daily.     PARoxetine  (PAXIL ) 40 MG tablet Take 1 tablet (40 mg total) by mouth every morning. 90 tablet 1   potassium chloride (MICRO-K) 10 MEQ CR capsule Take 1 capsule twice a day by oral route for 10 days.     No current facility-administered medications on file prior to visit.   Allergies   Allergen Reactions   Fenofibrate     Aching   Statins Other (See Comments)    Diffuse pain, Simvastatin   Bactrim [Sulfamethoxazole-Trimethoprim] Rash   Sulfa Antibiotics Rash    bactrim   Zithromax [Azithromycin] Rash    Current Medications, Allergies, Past Medical History, Past Surgical History, Family History and Social History were reviewed in [employer] record.  Review of Systems:   Constitutional: Negative for fever, sweats, chills or weight loss.  Respiratory: Negative for shortness of breath.   Cardiovascular: Negative for chest pain, palpitations and leg swelling.  Gastrointestinal: See HPI.  Musculoskeletal: Negative for back pain or muscle aches.  Neurological: Negative for dizziness, headaches or paresthesias.   Physical Exam: BP 128/78 (BP Location: Left Arm, Patient Position: Sitting, Cuff Size: Normal)   Pulse 76   Ht 5' 3 (1.6 m)   Wt 163 lb 2 oz (74 kg)   BMI 28.90 kg/m  Wt Readings from Last 3 Encounters:  08/13/23 163 lb 2 oz (74 kg)  06/01/23 167  lb (75.8 kg)  11/23/22 166 lb (75.3 kg)    General: 68 year old female in no acute distress. Head: Normocephalic and atraumatic. Eyes: No scleral icterus. Conjunctiva pink . Ears: Normal auditory acuity. Mouth: Dentition intact. No ulcers or lesions.  Lungs: Clear throughout to auscultation. Heart: Regular rate and rhythm, no murmur. Abdomen: Soft, nontender and nondistended. No masses or hepatomegaly. Normal bowel sounds x 4 quadrants.  Rectal: Deferred.  Musculoskeletal: Symmetrical with no gross deformities. Extremities: No edema. Neurological: Alert oriented x 4. No focal deficits.  Psychological: Alert and cooperative. Normal mood and affect  Assessment and Recommendations:  68 year old female with elevated LFTs, hepatic steatosis and liver hemangiomas. Abdominal sonogram with elastography 09/02/2021 showed a median kPa 3.4 which indicated low probability of advanced liver  disease. Liver MRI diffuse 09/02/2021 showed diffuse hepatic steatosis with stable bilobar hemangioma.  -Request copy of 05/2023 hepatic panel from PCP -CBC, CMP -Abdominal sonogram with elastography  -Check AFP if imaging identified any evidence of cirrhosis or any liver lesions that are not hemangiomas  -Reduce carbohydrates in diet, exercise as tolerated and lose weight recommended   Nausea  -Labs as ordered above and lipase level   Diarrhea, received 4 courses of antibiotics for UTI Dec 2024 - Jan 2025. Metformin possibly a contributing factor. -C. Diff PCR -Consider changing Metformin to extended release   Colon cancer screening. Colonoscopy 10/06/2021, no polyps. -Next screening colonoscopy due 10/2031  DM type II  Elevated coronary calcium score, aortic atherosclerosis, ascending aorta aneurysm  -Continue follow up with PCP

## 2023-08-16 ENCOUNTER — Ambulatory Visit (HOSPITAL_COMMUNITY): Payer: Medicare Other

## 2023-08-16 ENCOUNTER — Other Ambulatory Visit: Payer: Self-pay

## 2023-08-16 DIAGNOSIS — R7989 Other specified abnormal findings of blood chemistry: Secondary | ICD-10-CM

## 2023-08-16 DIAGNOSIS — K760 Fatty (change of) liver, not elsewhere classified: Secondary | ICD-10-CM

## 2023-08-17 NOTE — Progress Notes (Signed)
I agree with the assessment and plan as outlined by Ms. Riley Kill.

## 2023-08-19 ENCOUNTER — Ambulatory Visit: Payer: Medicare Other

## 2023-08-19 DIAGNOSIS — R7989 Other specified abnormal findings of blood chemistry: Secondary | ICD-10-CM

## 2023-08-19 DIAGNOSIS — K760 Fatty (change of) liver, not elsewhere classified: Secondary | ICD-10-CM

## 2023-08-20 ENCOUNTER — Ambulatory Visit (HOSPITAL_COMMUNITY)
Admission: RE | Admit: 2023-08-20 | Discharge: 2023-08-20 | Disposition: A | Discharge status: Home / Self Care | Payer: Medicare Other | Source: Ambulatory Visit | Attending: Nurse Practitioner | Admitting: Nurse Practitioner

## 2023-08-20 DIAGNOSIS — R7989 Other specified abnormal findings of blood chemistry: Secondary | ICD-10-CM | POA: Diagnosis present

## 2023-08-20 DIAGNOSIS — K760 Fatty (change of) liver, not elsewhere classified: Secondary | ICD-10-CM | POA: Diagnosis present

## 2023-08-20 LAB — CLOSTRIDIUM DIFFICILE TOXIN B, QUALITATIVE, REAL-TIME PCR: Toxigenic C. Difficile by PCR: NOT DETECTED

## 2023-08-27 ENCOUNTER — Telehealth: Payer: Self-pay | Admitting: Nurse Practitioner

## 2023-08-27 ENCOUNTER — Other Ambulatory Visit: Payer: Self-pay

## 2023-08-27 NOTE — Telephone Encounter (Signed)
Refer back to imaging result note 08/20/23. Discussed results & recommendations w/patient.

## 2023-08-27 NOTE — Telephone Encounter (Signed)
Sandra Willis, I forward her abdominal ultrasound with elastography results to you with Dr. Derek Mound recommendations.  Please contact the patient.  Thank you

## 2023-08-27 NOTE — Telephone Encounter (Signed)
Spoke with patient & advised her that we would be in touch with her as soon as Research scientist (physical sciences) and Dr. Leonides Schanz have reviewed her results. Advised her that both providers are covering hospital and that it may be early next week before she hears back.

## 2023-08-27 NOTE — Telephone Encounter (Signed)
Patient called and stated that she received her ultrasound results on her my chart but does not know why she has not received a call to go over those results with her. Patient is requesting a call back. Please advise.

## 2023-09-07 ENCOUNTER — Telehealth (HOSPITAL_COMMUNITY): Payer: Medicare Other | Admitting: Family

## 2023-09-07 ENCOUNTER — Telehealth (HOSPITAL_BASED_OUTPATIENT_CLINIC_OR_DEPARTMENT_OTHER): Payer: Medicare Other | Admitting: Family

## 2023-09-07 DIAGNOSIS — F332 Major depressive disorder, recurrent severe without psychotic features: Secondary | ICD-10-CM

## 2023-09-07 DIAGNOSIS — F411 Generalized anxiety disorder: Secondary | ICD-10-CM | POA: Diagnosis not present

## 2023-09-07 MED ORDER — PAROXETINE HCL 40 MG PO TABS: 40.0000 mg | ORAL_TABLET | ORAL | 1 refills | Status: DC

## 2023-09-07 NOTE — Progress Notes (Unsigned)
 Virtual Visit via Video Note  I connected with Sandra Willis on 09/07/23 at  4:30 PM EST by a video enabled telemedicine application and verified that I am speaking with the correct person using two identifiers.  Location: Patient: home in her car Provider: Home Office    I discussed the limitations of evaluation and management by telemedicine and the availability of in person appointments. The patient expressed understanding and agreed to proceed.  History of Present Illness:   I discussed the assessment and treatment plan with the patient. The patient was provided an opportunity to ask questions and all were answered. The patient agreed with the plan and demonstrated an understanding of the instructions.   The patient was advised to call back or seek an in-person evaluation if the symptoms worsen or if the condition fails to improve as anticipated.  I provided 15 minutes of non-face-to-face time during this encounter.   Oneta Rack, NP   BH MD/PA/NP OP Progress Note  09/07/2023 4:59 PM Sandra Willis  MRN:  [id number]  Chief Complaint: medication management   HPI: Sandra Willis 68 year old female presents for medication management follow-up appointment.  She was seen and evaluated via caregility virtual platform.  Sandra Willis carries a diagnosis related to major depressive disorder generalized anxiety disorder.  Currently prescribed Paxil 40 mg daily.  She reports she has been taking and tolerating medications well.  Does report some mood fluctuations and increased anxiety symptoms but is usually able to work through them in a few day. Stated most of her symptoms have resolved since her husband has retired. Reported he was a Naval architect.    Reported that since he is  home more often her anxiety and worry has decreased, and most of her resolved.stated that he has been supportive.  She denied any homicidal or suicidal ideations.  Denies auditory visual hallucinations.  Reports a good  appetite.   States she is resting well throughout the night.  Discussed following up with therapy services as needed.  She declined at this time.  Will follow-up 4 to 5 months for medication management discussed following up sooner if needed.  She is receptive to plan.  Support ,encouragement and reassurance was provided.   Visit Diagnosis:    ICD-10-CM   1. Severe episode of recurrent major depressive disorder, without psychotic features (HCC)  F33.2     2. GAD (generalized anxiety disorder)  F41.1       Past Psychiatric History:   Past Medical History:  Past Medical History:  Diagnosis Date   Anxiety    Aortic atherosclerosis (HCC)    Depression    Dermatitis    due to plants (poison ivy, sumac, oak)   Diabetes (HCC)    Fatty liver disease, nonalcoholic    Pt is under care of specialist   High blood pressure    High cholesterol    IBS (irritable bowel syndrome)    Low back pain    Plantar fasciitis    Varicose veins of left lower extremity    Vitamin D deficiency     Past Surgical History:  Procedure Laterality Date   CARPAL TUNNEL RELEASE  2020   cataracts Bilateral    DILATION AND CURETTAGE OF UTERUS     ELBOW SURGERY     hemeroidectomy      Family Psychiatric History:   Family History:  Family History  Problem Relation Age of Onset   Cancer Mother    Diabetes Mother  Alcohol abuse Father    Heart disease Father    High Cholesterol Father    Hypertension Father    Personality disorder Sister    Drug abuse Sister    Diabetes Sister    Alcohol abuse Brother    Colon cancer Neg Hx    Rectal cancer Neg Hx    Stomach cancer Neg Hx     Social History:  Social History   Socioeconomic History   Marital status: Married    Spouse name: Not on file   Number of children: 2   Years of education: Not on file   Highest education level: Not on file  Occupational History   Not on file  Tobacco Use   Smoking status: Never   Smokeless tobacco: Never   Vaping Use   Vaping status: Never Used  Substance and Sexual Activity   Alcohol use: No    Comment: quit 2000   Drug use: No   Sexual activity: Yes    Partners: Male  Other Topics Concern   Not on file  Social History Narrative   Pt lives in [place] with her husband. Pt has 2 adult children and has been married 41 yrs. They got married on her 83th birthday. Born and raised in [place] by her parents. Pt has one older brother and one older sister. 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 Caffeine 2 cups coffee on sat/ sun.  Sodas 1-2 daily during week.   Education 12th grade.    Social Drivers of Corporate investment banker Strain: Not on file  Food Insecurity: No Food Insecurity (09/25/2021)   [facility]   Hunger Vital Sign    Worried About Running Out of Food in the Last Year: Never true    Ran Out of Food in the Last Year: Never true  Transportation Needs: No Transportation Needs (09/25/2021)   Received from [facility], [facility]   [facility] - Transportation    Lack of Transportation (Medical): No    Lack of Transportation (Non-Medical): No  Physical Activity: Not on file  Stress: Not on file  Social Connections: Unknown (11/18/2021)   [facility] from [facility], [facility]   Social Network    Social Network: Not on file    Allergies:  Allergies  Allergen Reactions   Fenofibrate     Aching   Statins Other (See Comments)    Diffuse pain, Simvastatin   Bactrim [Sulfamethoxazole-Trimethoprim] Rash   Sulfa Antibiotics Rash    bactrim   Zithromax [Azithromycin] Rash    Metabolic Disorder Labs: No results found for: "HGBA1C", "MPG" No results found for: "PROLACTIN" Lab Results  Component Value Date   CHOL 109 06/01/2023   TRIG 188 (H) 06/01/2023   HDL 51 06/01/2023   CHOLHDL 2.1 06/01/2023   LDLCALC 28 06/01/2023   No results found for: "TSH"  Therapeutic Level Labs: No results found for:  "LITHIUM" No results found for: "VALPROATE" No results found for: "CBMZ"  Current Medications: Current Outpatient Medications  Medication Sig Dispense Refill   atenolol-chlorthalidone (TENORETIC) 50-25 MG tablet Take 0.5 tablets by mouth daily.     ergocalciferol (VITAMIN D2) 50000 units capsule Take 50,000 Units by mouth once a week.     Evolocumab (REPATHA SURECLICK) 140 MG/ML SOAJ Inject 140 mg into the skin every 14 (fourteen) days. 6 mL 5   metFORMIN (GLUCOPHAGE) 500 MG tablet Take 500 mg by  mouth 2 (two) times daily.     Multiple Vitamin (MULTIVITAMIN) tablet Take 1 tablet by mouth daily.     PARoxetine (PAXIL) 40 MG tablet Take 1 tablet (40 mg total) by mouth every morning. 90 tablet 1   potassium chloride (MICRO-K) 10 MEQ CR capsule Take 1 capsule twice a day by oral route for 10 days.     No current facility-administered medications for this visit.     Musculoskeletal:   Psychiatric Specialty Exam: Review of Systems  There were no vitals taken for this visit.There is no height or weight on file to calculate BMI.  General Appearance: Casual  Eye Contact:  Good  Speech:  Clear and Coherent  Volume:  Normal  Mood:  Euthymic  Affect:  Congruent  Thought Process:  Coherent  Orientation:  Full (Time, Place, and Person)  Thought Content: Logical   Suicidal Thoughts:  No  Homicidal Thoughts:  No  Memory:  Immediate;   Good  Judgement:  Good  Insight:  Good  Psychomotor Activity:  Normal  Concentration:  Concentration: Good  Recall:  Good  Fund of Knowledge: Good  Language: Good  Akathisia:  No  Handed:  Right  AIMS (if indicated): not done  Assets:  Communication Skills Desire for Improvement Social Support  ADL's:  Intact  Cognition: WNL  Sleep:  Good   Screenings: PHQ2-9    Flowsheet Row Office Visit [facility]/18/[facility]  PHQ-2 Total Score 3  PHQ-9 Total Score 8        Assessment and Plan: Shayona Hibbitts 68 year old female presents for medication management follow-up appointment.  Carries a diagnosis with depression and anxiety.  Reports overall her mood has stabilized.  States most of her symptoms of worry was related to her husband traveling however he has since retired.  Patient is currently prescribed Paxil 40 mg daily states she is taking her medications as directed.  Does report intermittent anxiety/panic attack however is able to utilize coping skills to work through events when they arise.  No documented concerns for safety.  Support encouragement reassurance was provided.  Patient to follow-up 4-5 months for medication management  Collaboration of Care: Collaboration of Care: Medication Management AEB continue Paxil 40 mg daily  Patient/Guardian was advised Release of Information must be obtained prior to any record release in order to collaborate their care with an outside provider. Patient/Guardian was advised if they have not already done so to contact the registration department to sign all necessary forms in order for [place] to release information regarding their care.   Consent: Patient/Guardian gives verbal consent for treatment and assignment of benefits for services provided during this visit. Patient/Guardian expressed understanding and agreed to proceed.    Oneta Rack, NP 09/07/2023, 4:59 PM

## 2023-09-13 ENCOUNTER — Other Ambulatory Visit (INDEPENDENT_AMBULATORY_CARE_PROVIDER_SITE_OTHER)

## 2023-09-13 DIAGNOSIS — R7989 Other specified abnormal findings of blood chemistry: Secondary | ICD-10-CM

## 2023-09-13 DIAGNOSIS — K760 Fatty (change of) liver, not elsewhere classified: Secondary | ICD-10-CM

## 2023-09-13 LAB — HEPATIC FUNCTION PANEL
ALT: 54 U/L — ABNORMAL HIGH (ref 0–35)
AST: 42 U/L — ABNORMAL HIGH (ref 0–37)
Albumin: 4.8 g/dL (ref 3.5–5.2)
Alkaline Phosphatase: 69 U/L (ref 39–117)
Bilirubin, Direct: 0.1 mg/dL (ref 0.0–0.3)
Total Bilirubin: 0.5 mg/dL (ref 0.2–1.2)
Total Protein: 7.5 g/dL (ref 6.0–8.3)

## 2023-09-13 LAB — IBC + FERRITIN
Ferritin: 419 ng/mL — ABNORMAL HIGH (ref 10.0–291.0)
Iron: 149 ug/dL — ABNORMAL HIGH (ref 42–145)
Saturation Ratios: 38.3 % (ref 20.0–50.0)
TIBC: 389.2 ug/dL (ref 250.0–450.0)
Transferrin: 278 mg/dL (ref 212.0–360.0)

## 2023-09-15 LAB — ALPHA-1-ANTITRYPSIN: A-1 Antitrypsin, Ser: 133 mg/dL (ref 83–199)

## 2023-09-15 LAB — MITOCHONDRIAL ANTIBODIES: Mitochondrial M2 Ab, IgG: <=20 U (ref <=20.0)

## 2023-09-21 NOTE — Addendum Note (Signed)
 Addended by: Rise Paganini on: 09/21/2023 08:34 AM   Modules accepted: Orders

## 2023-09-30 ENCOUNTER — Other Ambulatory Visit

## 2023-09-30 DIAGNOSIS — R7989 Other specified abnormal findings of blood chemistry: Secondary | ICD-10-CM

## 2023-10-08 LAB — HEMOCHROMATOSIS DNA-PCR(C282Y,H63D)

## 2023-10-14 NOTE — Addendum Note (Signed)
 Addended by: Rise Paganini on: 10/14/2023 08:38 AM   Modules accepted: Orders

## 2023-10-14 NOTE — Telephone Encounter (Signed)
 See lab 09/30/2023, response sent to patient.

## 2023-10-17 ENCOUNTER — Other Ambulatory Visit: Payer: Self-pay | Admitting: Cardiology

## 2023-10-17 DIAGNOSIS — E782 Mixed hyperlipidemia: Secondary | ICD-10-CM

## 2023-10-17 DIAGNOSIS — I7121 Aneurysm of the ascending aorta, without rupture: Secondary | ICD-10-CM

## 2023-10-17 DIAGNOSIS — R931 Abnormal findings on diagnostic imaging of heart and coronary circulation: Secondary | ICD-10-CM

## 2023-10-18 ENCOUNTER — Other Ambulatory Visit: Payer: Self-pay | Admitting: Cardiology

## 2023-10-18 DIAGNOSIS — E782 Mixed hyperlipidemia: Secondary | ICD-10-CM

## 2023-10-18 DIAGNOSIS — R931 Abnormal findings on diagnostic imaging of heart and coronary circulation: Secondary | ICD-10-CM

## 2023-10-18 DIAGNOSIS — I7121 Aneurysm of the ascending aorta, without rupture: Secondary | ICD-10-CM

## 2023-10-21 ENCOUNTER — Other Ambulatory Visit: Payer: Self-pay

## 2023-10-21 DIAGNOSIS — K760 Fatty (change of) liver, not elsewhere classified: Secondary | ICD-10-CM

## 2023-10-21 DIAGNOSIS — R7989 Other specified abnormal findings of blood chemistry: Secondary | ICD-10-CM

## 2023-10-21 NOTE — Telephone Encounter (Signed)
 Lab orders in epic along with reminder. Recall entered in epic for .

## 2023-10-22 ENCOUNTER — Telehealth: Payer: Self-pay | Admitting: Cardiology

## 2023-10-22 DIAGNOSIS — R931 Abnormal findings on diagnostic imaging of heart and coronary circulation: Secondary | ICD-10-CM

## 2023-10-22 DIAGNOSIS — I7121 Aneurysm of the ascending aorta, without rupture: Secondary | ICD-10-CM

## 2023-10-22 DIAGNOSIS — E782 Mixed hyperlipidemia: Secondary | ICD-10-CM

## 2023-10-22 NOTE — Telephone Encounter (Signed)
*  STAT* If patient is at the pharmacy, call can be transferred to refill team.   1. Which medications need to be refilled? (please list name of each medication and dose if known) Evolocumab (REPATHA SURECLICK) 140 MG/ML SOAJ    2. Would you like to

## 2023-10-26 MED ORDER — REPATHA SURECLICK 140 MG/ML ~~LOC~~ SOAJ: 140.0000 mg | SUBCUTANEOUS | 11 refills | Status: DC

## 2023-10-29 ENCOUNTER — Telehealth: Payer: Self-pay | Admitting: Cardiology

## 2023-10-29 NOTE — Telephone Encounter (Signed)
 Pt c/o medication issue:  1. Name of Medication:   Evolocumab  (REPATHA  SURECLICK) 140 MG/ML SOAJ    2. How are you currently taking this medication (dosage and times per day)? As written  3. Are you having a reaction (difficulty breathing--STAT)? No   4. What is your medication issue? Pt is needing a refill and  she is checking if prior Siegfried Dress has been done. Please advise

## 2023-11-01 ENCOUNTER — Telehealth: Payer: Self-pay | Admitting: Pharmacy Technician

## 2023-11-01 ENCOUNTER — Telehealth: Payer: Self-pay | Admitting: Cardiology

## 2023-11-01 ENCOUNTER — Other Ambulatory Visit (HOSPITAL_COMMUNITY): Payer: Self-pay

## 2023-11-01 NOTE — Telephone Encounter (Signed)
 Follow Up:    Sandra Willis called to say that patient's Repatha  was denied He said it was denied , because patient's insurance will not cover it He said you may appeal it  and send record attached to show why patient needs to take this particular medicine and can not use some other medicine

## 2023-11-01 NOTE — Telephone Encounter (Signed)
 Okay to appeal. We can check with lipid clinic if they have any recommendations.  Thanks MJP

## 2023-11-01 NOTE — Telephone Encounter (Signed)
 Pharmacy Patient Advocate Encounter   Received notification from Pt Calls Messages that prior authorization for REPATHA  is required/requested.   Insurance verification completed.   The patient is insured through [facility] .   Per test claim: PA required; PA submitted to above mentioned insurance via CoverMyMeds Key/confirmation #/EOC [id number] Status is pending

## 2023-11-01 NOTE — Telephone Encounter (Signed)
 See other encounter for denial notice -asking for praluent

## 2023-11-01 NOTE — Telephone Encounter (Signed)
  Pt said, Repatha  is more effective to her and can't take praluent due to will affect her liver enzyme. She would like to request if Dr. Filiberto Hug can send an appeal to get Repatha  approve

## 2023-11-01 NOTE — Telephone Encounter (Signed)
 Pharmacy Patient Advocate Encounter  Received notification from [employer] that Prior Authorization for repatha  has been DENIED.  Full denial letter will be uploaded to the media tab. See denial reason below.

## 2023-11-01 NOTE — Telephone Encounter (Signed)
 Pt calling to check on the status of this medication. Pt would like a c/b

## 2023-11-01 NOTE — Telephone Encounter (Signed)
 Spoke with patient and she is aware prior auth was submitted this morning. Once its complete we will give her a call

## 2023-11-03 ENCOUNTER — Other Ambulatory Visit (HOSPITAL_COMMUNITY): Payer: Self-pay

## 2023-11-03 ENCOUNTER — Telehealth: Payer: Self-pay | Admitting: Pharmacy Technician

## 2023-11-03 NOTE — Telephone Encounter (Signed)
 Please do PA for praluent

## 2023-11-03 NOTE — Telephone Encounter (Signed)
 Pharmacy Patient Advocate Encounter   Received notification from Pt Calls Messages that prior authorization for praluent is required/requested.   Insurance verification completed.   The patient is insured through [facility] .   Per test claim: PA required; PA submitted to above mentioned insurance via CoverMyMeds Key/confirmation #/EOC [id number] Status is pending

## 2023-11-04 ENCOUNTER — Other Ambulatory Visit (HOSPITAL_COMMUNITY): Payer: Self-pay

## 2023-11-04 NOTE — Telephone Encounter (Signed)
 Pharmacy Patient Advocate Encounter  Received notification from [facility] that Prior Authorization for praluent  has been APPROVED from 10/20/23 to 07/05/2098  If someone can send the new rx to her pharmacy please? She has a Orthoptist as well.   Bin: [id number] Pcn: [id number] ID: [id number] GROUP: [id number]  Start Date 11/01/2023 End Date 10/30/2024 [employer] ID [id number] She has been getting this grant with repatha  prior at [facility] #/Case ID/Reference #: [id number]

## 2023-11-05 ENCOUNTER — Other Ambulatory Visit (HOSPITAL_COMMUNITY): Payer: Self-pay

## 2023-11-05 ENCOUNTER — Other Ambulatory Visit: Payer: Self-pay

## 2023-11-05 MED ORDER — PRALUENT 75 MG/ML ~~LOC~~ SOAJ
75.0000 mg | SUBCUTANEOUS | 3 refills | Status: AC
  Filled 2023-11-05: qty 6, 84d supply, fill #0
  Filled 2024-01-23 – 2024-01-24 (×2): qty 6, 84d supply, fill #1
  Filled 2024-04-13 (×2): qty 6, 84d supply, fill #2
  Filled 2024-07-07: qty 6, 84d supply, fill #3

## 2023-11-05 MED ORDER — PRALUENT 75 MG/ML ~~LOC~~ SOAJ: 75.0000 mg | SUBCUTANEOUS | 3 refills | Status: DC

## 2023-11-05 NOTE — Addendum Note (Signed)
 Addended by: Bingham Millette D on: 11/05/2023 08:39 AM   Modules accepted: Orders

## 2023-11-05 NOTE — Telephone Encounter (Signed)
 Prescription for Praluent  75 mg sent to [facility] in [place].

## 2023-11-05 NOTE — Addendum Note (Signed)
 Addended by: Tahirah Sara K on: 11/05/2023 07:48 AM   Modules accepted: Orders

## 2023-11-08 ENCOUNTER — Other Ambulatory Visit (HOSPITAL_COMMUNITY): Payer: Self-pay

## 2023-12-13 ENCOUNTER — Other Ambulatory Visit (HOSPITAL_COMMUNITY): Payer: Self-pay | Admitting: Family

## 2024-01-24 ENCOUNTER — Other Ambulatory Visit: Payer: Self-pay

## 2024-01-24 ENCOUNTER — Other Ambulatory Visit (HOSPITAL_COMMUNITY): Payer: Self-pay

## 2024-02-29 ENCOUNTER — Encounter: Payer: Self-pay | Admitting: Internal Medicine

## 2024-02-29 ENCOUNTER — Ambulatory Visit: Payer: Self-pay | Admitting: Internal Medicine

## 2024-02-29 ENCOUNTER — Other Ambulatory Visit (INDEPENDENT_AMBULATORY_CARE_PROVIDER_SITE_OTHER)

## 2024-02-29 ENCOUNTER — Ambulatory Visit (INDEPENDENT_AMBULATORY_CARE_PROVIDER_SITE_OTHER): Admitting: Internal Medicine

## 2024-02-29 VITALS — BP 124/80 | HR 70 | Ht 63.0 in | Wt 165.2 lb

## 2024-02-29 DIAGNOSIS — R1013 Epigastric pain: Secondary | ICD-10-CM | POA: Diagnosis not present

## 2024-02-29 DIAGNOSIS — R7989 Other specified abnormal findings of blood chemistry: Secondary | ICD-10-CM | POA: Diagnosis not present

## 2024-02-29 DIAGNOSIS — K760 Fatty (change of) liver, not elsewhere classified: Secondary | ICD-10-CM

## 2024-02-29 DIAGNOSIS — K219 Gastro-esophageal reflux disease without esophagitis: Secondary | ICD-10-CM

## 2024-02-29 DIAGNOSIS — E559 Vitamin D deficiency, unspecified: Secondary | ICD-10-CM

## 2024-02-29 DIAGNOSIS — R5383 Other fatigue: Secondary | ICD-10-CM

## 2024-02-29 LAB — COMPREHENSIVE METABOLIC PANEL WITH GFR
ALT: 52 U/L — ABNORMAL HIGH (ref 0–35)
AST: 41 U/L — ABNORMAL HIGH (ref 0–37)
Albumin: 4.6 g/dL (ref 3.5–5.2)
Alkaline Phosphatase: 73 U/L (ref 39–117)
BUN: 13 mg/dL (ref 6–23)
CO2: 31 meq/L (ref 19–32)
Calcium: 10 mg/dL (ref 8.4–10.5)
Chloride: 98 meq/L (ref 96–112)
Creatinine, Ser: 0.67 mg/dL (ref 0.40–1.20)
GFR: 90.88 mL/min (ref >60.00)
Glucose, Bld: 100 mg/dL — ABNORMAL HIGH (ref 70–99)
Potassium: 4.6 meq/L (ref 3.5–5.1)
Sodium: 141 meq/L (ref 135–145)
Total Bilirubin: 0.6 mg/dL (ref 0.2–1.2)
Total Protein: 7.6 g/dL (ref 6.0–8.3)

## 2024-02-29 LAB — CBC WITH DIFFERENTIAL/PLATELET
Basophils Absolute: 0.1 K/uL (ref 0.0–0.1)
Basophils Relative: 0.9 % (ref 0.0–3.0)
Eosinophils Absolute: 0.2 K/uL (ref 0.0–0.7)
Eosinophils Relative: 2.6 % (ref 0.0–5.0)
HCT: 47.3 % — ABNORMAL HIGH (ref 36.0–46.0)
Hemoglobin: 15.8 g/dL — ABNORMAL HIGH (ref 12.0–15.0)
Lymphocytes Relative: 38.6 % (ref 12.0–46.0)
Lymphs Abs: 3 K/uL (ref 0.7–4.0)
MCHC: 33.3 g/dL (ref 30.0–36.0)
MCV: 90.7 fl (ref 78.0–100.0)
Monocytes Absolute: 0.5 K/uL (ref 0.1–1.0)
Monocytes Relative: 5.9 % (ref 3.0–12.0)
Neutro Abs: 4.1 K/uL (ref 1.4–7.7)
Neutrophils Relative %: 52 % (ref 43.0–77.0)
Platelets: 335 K/uL (ref 150.0–400.0)
RBC: 5.22 Mil/uL — ABNORMAL HIGH (ref 3.87–5.11)
RDW: 13.1 % (ref 11.5–15.5)
WBC: 7.8 K/uL (ref 4.0–10.5)

## 2024-02-29 LAB — IBC + FERRITIN
Ferritin: 215.4 ng/mL (ref 10.0–291.0)
Iron: 173 ug/dL — ABNORMAL HIGH (ref 42–145)
Saturation Ratios: 42.6 % (ref 20.0–50.0)
TIBC: 406 ug/dL (ref 250.0–450.0)
Transferrin: 290 mg/dL (ref 212.0–360.0)

## 2024-02-29 LAB — PROTIME-INR
INR: 1 ratio (ref 0.8–1.0)
Prothrombin Time: 10.9 s (ref 9.6–13.1)

## 2024-02-29 LAB — VITAMIN B12: Vitamin B-12: 531 pg/mL (ref 211–911)

## 2024-02-29 LAB — VITAMIN D 25 HYDROXY (VIT D DEFICIENCY, FRACTURES): VITD: 54.7 ng/mL (ref 30.00–100.00)

## 2024-02-29 MED ORDER — PANTOPRAZOLE SODIUM 20 MG PO TBEC: 20.0000 mg | DELAYED_RELEASE_TABLET | Freq: Every day | ORAL | 3 refills | Status: AC

## 2024-02-29 NOTE — Progress Notes (Signed)
 02/29/2024 Sandra Willis [id number] [birth year]   Chief Complaint: Elevated LFTs, MASH  History of Present Illness: Sandra Willis is a 68 year old female with a past medical history of anxiety, depression, hypertensin, elevated coronary calcium score, ascending aorta aneurysms, DM type II and hepatic steatosis presents for follow up of elevated LFTs due to [facility]  She received Hep A and Hep B vaccination series at the [facility] pharmacy.   Interval History: Patient presents with her husband to clinic today. She is tired frequently. She is not really having diarrhea now. She was changed to extended release metformin, which significantly helped with her diarrhea. Denies nausea. Denies blood in the stools or swelling in the abdomen or legs. She has some memory issues but attributes this to normal aging. Denies alcohol. She just started her meloxicam for some arthritis. Has had some upper abdominal pain more recently. Occasionally has acid reflux issues. Denies dysphagia. She is eating and drinking well. Weight has been up and down. Patient has had some hair loss  Wt Readings from Last 3 Encounters:  02/29/24 165 lb 3.2 oz (74.9 kg)  08/13/23 163 lb 2 oz (74 kg)  06/01/23 167 lb (75.8 kg)   Past Medical History:  Diagnosis Date   Anxiety    Aortic atherosclerosis (HCC)    Depression    Dermatitis    due to plants (poison ivy, sumac, oak)   Diabetes (HCC)    Fatty liver disease, nonalcoholic    Pt is under care of specialist   High blood pressure    High cholesterol    IBS (irritable bowel syndrome)    Low back pain    Plantar fasciitis    Varicose veins of left lower extremity    Vitamin D  deficiency    Past Surgical History:  Procedure Laterality Date   CARPAL TUNNEL RELEASE  2020   cataracts Bilateral    DILATION AND CURETTAGE OF UTERUS     ELBOW SURGERY     hemeroidectomy     Current Outpatient Medications on File Prior to Visit  Medication Sig Dispense Refill    Alirocumab  (PRALUENT ) 75 MG/ML SOAJ Inject 1 mL (75 mg total) into the skin every 14 (fourteen) days. 6 mL 3   atenolol-chlorthalidone (TENORETIC) 50-25 MG tablet Take 0.5 tablets by mouth daily.     ergocalciferol  (VITAMIN D2) 50000 units capsule Take 50,000 Units by mouth once a week.     metFORMIN (GLUCOPHAGE) 500 MG tablet Take 500 mg by mouth 2 (two) times daily.     Multiple Vitamin (MULTIVITAMIN) tablet Take 1 tablet by mouth daily.     PARoxetine  (PAXIL ) 40 MG tablet Take 1 tablet (40 mg total) by mouth every morning. 90 tablet 1   potassium chloride (MICRO-K) 10 MEQ CR capsule Take 1 capsule twice a day by oral route for 10 days.     No current facility-administered medications on file prior to visit.   Allergies  Allergen Reactions   Fenofibrate     Aching   Statins Other (See Comments)    Diffuse pain, Simvastatin   Bactrim [Sulfamethoxazole-Trimethoprim] Rash   Sulfa Antibiotics Rash    bactrim   Zithromax [Azithromycin] Rash    Current Medications, Allergies, Past Medical History, Past Surgical History, Family History and Social History were reviewed in [employer] record.  Physical Exam: Ht 5' 3 (1.6 m)   Wt 165 lb 3.2 oz (74.9 kg)   BMI 29.26 kg/m  Wt Readings from Last 3 Encounters:  02/29/24 165 lb 3.2 oz (74.9 kg)  08/13/23 163 lb 2 oz (74 kg)  06/01/23 167 lb (75.8 kg)    General: 68 year old female in no acute distress. Head: Normocephalic and atraumatic. Eyes: No scleral icterus. Conjunctiva pink . Lungs: Clear throughout to auscultation. Heart: Regular rate and rhythm, no murmur. Abdomen: Soft, mildly tender in the epigastric area, and nondistended. No masses or hepatomegaly. Normal bowel sounds x 4 quadrants.  Musculoskeletal: Symmetrical with no gross deformities. Extremities: No edema. Neurological: Alert oriented x 4. No asterixis Psychological: Alert and cooperative. Normal mood and affect  Labs 08/2021: LFTs with elevated  AST of 39 and ALT of 44. Hep B surface antigen negative. Hep B surface antibody negative. HCV Ab NR. Hepatitis A antibody NR. ANA, IgG, and ASMA normal. INR nml. Ferritin mildly elevated at 556.1.  Labs 08/2023: CBC unremarkable. CMP with mildly elevated Na of 146, low potassium of 3.2, elevated AST of 78, and elevated ALT of 96. Lipase nml. C dif negative.  Labs 09/2023: HFE showed that the patient is a heterozygote for H63D. LFTs with elevated AST 42 and ALT of 54. AMA negative. A1AT negative. Ferritin is elevted at 419 and iron is elevated at 149.  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 Additional cavernous hemangiomas noted within the liver parenchyma. 10 mm hypervascular lesion in the dome of the liver cannot be definitively characterized but is likely benign and also probably a cavernous hemangioma. Follow-up MRI in 6 months could be used to ensure stability.  Abdominal sonogram with elastography 09/02/2021 showed a median kPa 3.4 which indicated low probability of advanced liver disease.   Liver MRI diffuse 09/02/2021 showed diffuse hepatic steatosis with stable bilobar hemangioma.   Ab U/S with elastography 08/20/23: IMPRESSION: 1. Increased hepatic echotexture, most commonly seen with steatosis. Correlation with LFT's is recommended. 2.  Simple right renal cyst. ULTRASOUND HEPATIC ELASTOGRAPHY: Median kPa: 3.3 Diagnostic category:  < or = 5 kPa: high probability of being normal   Colonoscopy 10/06/2021 which showed diverticulosis throughout the colon and internal hemorrhoids. Recall colonoscopy due 10/2031.  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 Luckily her recent elastography did not show significant liver fibrosis.   -Check CMP, CBC, PT/INR, ferritin/IBC, vitamin D , vitamin B12 -Encourage weight loss -Continue coffee consumption -RTC in 6 months   GERD Epigastric ab pain Could be related to recent meloxicam use. Will start pantoprazole  to help with reflux control and to help protect her stomach lining while on meloxicam - Start pantoprazole  20 mg QD  Colon cancer screening. Colonoscopy 10/06/2021, no polyps. -Next screening colonoscopy due 10/2031  DM type II  Elevated coronary calcium score, aortic atherosclerosis, ascending aorta aneurysm  -Continue follow up with PCP  Estefana Kidney, MD  I spent 35 minutes of time, including in depth chart review, independent review of results as outlined above, communicating results with the patient directly, face-to-face time with the patient, coordinating care, and ordering studies and medications as appropriate, and documentation.

## 2024-02-29 NOTE — Patient Instructions (Signed)
 Your provider has requested that you go to the basement level for lab work before leaving today. Press B on the elevator. The lab is located at the first door on the left as you exit the elevator.  We have sent the following medications to your pharmacy for you to pick up at your convenience:  Pantoprazole   _______________________________________________________  If your blood pressure at your visit was 140/90 or greater, please contact your primary care physician to follow up on this.  _______________________________________________________  If you are age 67 or older, your body mass index should be between 23-30. Your Body mass index is 29.26 kg/m. If this is out of the aforementioned range listed, please consider follow up with your Primary Care Provider.  If you are age 56 or younger, your body mass index should be between 19-25. Your Body mass index is 29.26 kg/m. If this is out of the aformentioned range listed, please consider follow up with your Primary Care Provider.   ________________________________________________________  The [facility] providers would like to encourage you to use MYCHART to communicate with providers for non-urgent requests or questions.  Due to long hold times on the telephone, sending your provider a message by [facility] may be a faster and more efficient way to get a response.  Please allow 48 business hours for a response.  Please remember that this is for non-urgent requests.  _______________________________________________________  [facility] is using a team-based approach to care.  Your team is made up of your doctor and two to three APPS. Our APPS (Nurse Practitioners and Physician Assistants) work with your physician to ensure care continuity for you. They are fully qualified to address your health concerns and develop a treatment plan. They communicate directly with your gastroenterologist to care for you. Seeing the Advanced Practice  Practitioners on your physician's team can help you by facilitating care more promptly, often allowing for earlier appointments, access to diagnostic testing, procedures, and other specialty referrals.

## 2024-03-16 ENCOUNTER — Other Ambulatory Visit (HOSPITAL_COMMUNITY): Payer: Self-pay | Admitting: Family

## 2024-03-21 ENCOUNTER — Telehealth (HOSPITAL_BASED_OUTPATIENT_CLINIC_OR_DEPARTMENT_OTHER): Admitting: Family

## 2024-03-21 ENCOUNTER — Other Ambulatory Visit (HOSPITAL_COMMUNITY): Payer: Self-pay

## 2024-03-21 ENCOUNTER — Telehealth (HOSPITAL_COMMUNITY): Admitting: Family

## 2024-03-21 DIAGNOSIS — F332 Major depressive disorder, recurrent severe without psychotic features: Secondary | ICD-10-CM

## 2024-03-21 MED ORDER — PAROXETINE HCL 40 MG PO TABS: 40.0000 mg | ORAL_TABLET | ORAL | 0 refills | Status: DC

## 2024-03-21 MED ORDER — PAROXETINE HCL 40 MG PO TABS: 40.0000 mg | ORAL_TABLET | ORAL | 1 refills | Status: DC

## 2024-03-21 NOTE — Progress Notes (Signed)
 Virtual Visit via Video Note  I connected with [name] on 03/21/24 at  3:00 PM EDT by a video enabled telemedicine application and verified that I am speaking with the correct person using two identifiers.  Location: Patient: Office Provider: Home   I discussed the limitations of evaluation and management by telemedicine and the availability of in person appointments. The patient expressed understanding and agreed to proceed.  I discussed the assessment and treatment plan with the patient. The patient was provided an opportunity to ask questions and all were answered. The patient agreed with the plan and demonstrated an understanding of the instructions.   The patient was advised to call back or seek an in-person evaluation if the symptoms worsen or if the condition fails to improve as anticipated.  I provided 15 minutes of non-face-to-face time during this encounter.   Staci LOISE Kerns, NP   BH MD/PA/NP OP Progress Note  03/21/2024 3:54 PM [name]  MRN:  [id number]  Chief Complaint: Medication Management   HPI: Sandra Willis is a 68 year old female who presents for medication management follow-up appointment.  Seen and evaluated via virtual platform caregility. she reports overall her mood has been stabilized as she has been taking Paxil  40 mg daily denying any medication side effects.  States  I do have good and bad days, some days I do feel down but it does not last long.  She denied concerns related to suicidal or homicidal ideations.  Denies auditory visual hallucinations.    Sanaiya reports her husband recently retired however broke his ankle so he has not been able to enjoy his retirement.  States she has been assisting in his healthcare needs.  States she occupies her time by quilting or sewing.  No concerns related to depression or depressive symptoms.  She reports a good appetite.  States she is rested well throughout the night.  Will make medications available times  90 tablets x 1 refill.  Patient to follow-up 4 to 5 months for medication management follow-up appointment.  Support encouragement reassurance was provided.    Visit Diagnosis:    ICD-10-CM   1. Severe episode of recurrent major depressive disorder, without psychotic features (HCC)  F33.2       Past Psychiatric History:   Past Medical History:  Past Medical History:  Diagnosis Date   Anxiety    Aortic atherosclerosis (HCC)    Depression    Dermatitis    due to plants (poison ivy, sumac, oak)   Diabetes (HCC)    Fatty liver disease, nonalcoholic    Pt is under care of specialist   High blood pressure    High cholesterol    IBS (irritable bowel syndrome)    Low back pain    Plantar fasciitis    Varicose veins of left lower extremity    Vitamin D  deficiency     Past Surgical History:  Procedure Laterality Date   CARPAL TUNNEL RELEASE  2020   cataracts Bilateral    DILATION AND CURETTAGE OF UTERUS     ELBOW SURGERY     hemeroidectomy      Family Psychiatric History:   Family History:  Family History  Problem Relation Age of Onset   Cancer Mother    Diabetes Mother    Alcohol abuse Father    Heart disease Father    High Cholesterol Father    Hypertension Father    Personality disorder Sister    Drug abuse Sister  Diabetes Sister    Alcohol abuse Brother    Colon cancer Neg Hx    Rectal cancer Neg Hx    Stomach cancer Neg Hx     Social History:  Social History   Socioeconomic History   Marital status: Married    Spouse name: Not on file   Number of children: 2   Years of education: Not on file   Highest education level: Not on file  Occupational History   Not on file  Tobacco Use   Smoking status: Never   Smokeless tobacco: Never  Vaping Use   Vaping status: Never Used  Substance and Sexual Activity   Alcohol use: No    Comment: quit 2000   Drug use: No   Sexual activity: Yes    Partners: Male  Other Topics Concern   Not on file  Social  History Narrative   Pt lives in [place] with her husband. Pt has 2 adult children and has been married 41 yrs. They got married on her 24th birthday. Born and raised in [place] by her parents. Pt has one older brother and one older sister. 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 Caffeine 2 cups coffee on sat/ sun.  Sodas 1-2 daily during week.   Education 12th grade.    Social Drivers of Corporate investment banker Strain: Not on file  Food Insecurity: No Food Insecurity (09/25/2021)   Received [facility]   Hunger Vital Sign    Within the past 12 months, you worried that your food would run out before you got the money to buy more.: Never true    Within the past 12 months, the food you bought just didn't last and you didn't have money to get more.: Never true  Transportation Needs: No Transportation Needs (09/25/2021)   Received from [facility]   PRAPARE - Transportation    Lack of Transportation (Medical): No    Lack of Transportation (Non-Medical): No  Physical Activity: Not on file  Stress: Not on file  Social Connections: Unknown (11/18/2021)   Received from [facility]   Social Network    Social Network: Not on file    Allergies:  Allergies  Allergen Reactions   Fenofibrate     Aching   Statins Other (See Comments)    Diffuse pain, Simvastatin   Bactrim [Sulfamethoxazole-Trimethoprim] Rash   Sulfa Antibiotics Rash    bactrim   Zithromax [Azithromycin] Rash    Metabolic Disorder Labs: No results found for: HGBA1C, MPG No results found for: PROLACTIN Lab Results  Component Value Date   CHOL 109 06/01/2023   TRIG 188 (H) 06/01/2023   HDL 51 06/01/2023   CHOLHDL 2.1 06/01/2023   LDLCALC 28 06/01/2023   No results found for: TSH  Therapeutic Level Labs: No results found for: LITHIUM No results found for: VALPROATE No results found for: CBMZ  Current Medications: Current Outpatient Medications  Medication Sig Dispense  Refill   Alirocumab  (PRALUENT ) 75 MG/ML SOAJ Inject 1 mL (75 mg total) into the skin every 14 (fourteen) days. 6 mL 3   atenolol-chlorthalidone (TENORETIC) 50-25 MG tablet Take 0.5 tablets by mouth daily.     ergocalciferol  (VITAMIN D2) 50000 units capsule Take 50,000 Units by mouth once a week.     metFORMIN (GLUCOPHAGE) 500 MG tablet Take 500 mg by mouth 2 (two) times daily.     Multiple Vitamin (MULTIVITAMIN) tablet Take 1 tablet by mouth  daily.     pantoprazole  (PROTONIX ) 20 MG tablet Take 1 tablet (20 mg total) by mouth daily. 90 tablet 3   PARoxetine  (PAXIL ) 40 MG tablet Take 1 tablet (40 mg total) by mouth every morning. 90 tablet 1   potassium chloride (MICRO-K) 10 MEQ CR capsule Take 1 capsule twice a day by oral route for 10 days.     No current facility-administered medications for this visit.     Musculoskeletal: Strength & Muscle Tone: within normal limits Gait & Station: normal Patient leans: N/A  Psychiatric Specialty Exam: Review of Systems  There were no vitals taken for this visit.There is no height or weight on file to calculate BMI.  General Appearance: Casual  Eye Contact:  Good  Speech:  Clear and Coherent  Volume:  Normal  Mood:  Anxious and Depressed  Affect:  Congruent  Thought Process:  Coherent  Orientation:  Full (Time, Place, and Person)  Thought Content: Logical   Suicidal Thoughts:  No  Homicidal Thoughts:  No  Memory:  Immediate;   Good Recent;   Good  Judgement:  Good  Insight:  Good  Psychomotor Activity:  Normal  Concentration:  Concentration: Good  Recall:  Good  Fund of Knowledge: Good  Language: Good  Akathisia:  No  Handed:  Right  AIMS (if indicated): not done  Assets:  Communication Skills Desire for Improvement  ADL's:  Intact  Cognition: WNL  Sleep:  Good   Screenings: PHQ2-9    Flowsheet Row [facility]  PHQ-2 Total Score 3  PHQ-9 Total Score 8      Assessment and Plan: Arlena Marsan 68 year old female presents for medication management follow-up appointment.  She was seen and evaluated via virtual platform.  Reports mood has been stable.  States she has been compliant with medication.  No concerns related to suicidal or homicidal ideations.  Rating her depression 3 out of 10 with 10 being the worst.  Reports mild mood fluctuation.  States she has been able to continue to use coping skills.  No other concerns noted at this visit.  Patient to follow-up 4 to 5 months for medication management.  Support, encouragement and  reassurance was provided.  Collaboration of Care: Collaboration of Care: Medication Management AEB continue Paxil  40 mg as directed  Patient/Guardian was advised Release of Information must be obtained prior to any record release in order to collaborate their care with an outside provider. Patient/Guardian was advised if they have not already done so to contact the registration department to sign all necessary forms in order for us  to release information regarding their care.   Consent: Patient/Guardian gives verbal consent for treatment and assignment of benefits for services provided during this visit. Patient/Guardian expressed understanding and agreed to proceed.    Staci LOISE Kerns, NP 03/21/2024, 3:54 PM

## 2024-03-21 NOTE — Progress Notes (Deleted)
 BH MD/PA/NP OP Progress Note  03/21/2024 3:43 PM DON GIARRUSSO  MRN:  [id number]  Chief Complaint: No chief complaint on file.  HPI: *** Visit Diagnosis: No diagnosis found.  Past Psychiatric History: ***  Past Medical History:  Past Medical History:  Diagnosis Date   Anxiety    Aortic atherosclerosis (HCC)    Depression    Dermatitis    due to plants (poison ivy, sumac, oak)   Diabetes (HCC)    Fatty liver disease, nonalcoholic    Pt is under care of specialist   High blood pressure    High cholesterol    IBS (irritable bowel syndrome)    Low back pain    Plantar fasciitis    Varicose veins of left lower extremity    Vitamin D  deficiency     Past Surgical History:  Procedure Laterality Date   CARPAL TUNNEL RELEASE  2020   cataracts Bilateral    DILATION AND CURETTAGE OF UTERUS     ELBOW SURGERY     hemeroidectomy      Family Psychiatric History: ***  Family History:  Family History  Problem Relation Age of Onset   Cancer Mother    Diabetes Mother    Alcohol abuse Father    Heart disease Father    High Cholesterol Father    Hypertension Father    Personality disorder Sister    Drug abuse Sister    Diabetes Sister    Alcohol abuse Brother    Colon cancer Neg Hx    Rectal cancer Neg Hx    Stomach cancer Neg Hx     Social History:  Social History   Socioeconomic History   Marital status: Married    Spouse name: Not on file   Number of children: 2   Years of education: Not on file   Highest education level: Not on file  Occupational History   Not on file  Tobacco Use   Smoking status: Never   Smokeless tobacco: Never  Vaping Use   Vaping status: Never Used  Substance and Sexual Activity   Alcohol use: No    Comment: quit 2000   Drug use: No   Sexual activity: Yes    Partners: Male  Other Topics Concern   Not on file  Social History Narrative   Pt lives in [place] with her husband. Pt has 2 adult children and has been married 41 yrs. They  got married on her 23th birthday. Born and raised in [place] by her parents. Pt has one older brother and one older sister. 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 Caffeine 2 cups coffee on sat/ sun.  Sodas 1-2 daily during week.   Education 12th grade.    Social Drivers of Corporate investment banker Strain: Not on file  Food Insecurity: No Food Insecurity (09/25/2021)   Received [facility]   Hunger Vital Sign    Within the past 12 months, you worried that your food would run out before you got the money to buy more.: Never true    Within the past 12 months, the food you bought just didn't last and you didn't have money to get more.: Never true  Transportation Needs: No Transportation Needs (09/25/2021)   Received from [facility]   PRAPARE - Transportation    Lack of Transportation (Medical): No    Lack of Transportation (Non-Medical): No  Physical Activity: Not on file  Stress: Not on file  Social Connections: Unknown (11/18/2021)   Received from [facility]   Social Network    Social Network: Not on file    Allergies:  Allergies  Allergen Reactions   Fenofibrate     Aching   Statins Other (See Comments)    Diffuse pain, Simvastatin   Bactrim [Sulfamethoxazole-Trimethoprim] Rash   Sulfa Antibiotics Rash    bactrim   Zithromax [Azithromycin] Rash    Metabolic Disorder Labs: No results found for: HGBA1C, MPG No results found for: PROLACTIN Lab Results  Component Value Date   CHOL 109 06/01/2023   TRIG 188 (H) 06/01/2023   HDL 51 06/01/2023   CHOLHDL 2.1 06/01/2023   LDLCALC 28 06/01/2023   No results found for: TSH  Therapeutic Level Labs: No results found for: LITHIUM No results found for: VALPROATE No results found for: CBMZ  Current Medications: Current Outpatient Medications  Medication Sig Dispense Refill   Alirocumab  (PRALUENT ) 75 MG/ML SOAJ Inject 1 mL (75 mg total) into the skin every 14 (fourteen) days. 6  mL 3   atenolol-chlorthalidone (TENORETIC) 50-25 MG tablet Take 0.5 tablets by mouth daily.     ergocalciferol  (VITAMIN D2) 50000 units capsule Take 50,000 Units by mouth once a week.     meloxicam (MOBIC) 15 MG tablet Take 15 mg by mouth daily.     metFORMIN (GLUCOPHAGE) 500 MG tablet Take 500 mg by mouth 2 (two) times daily.     Multiple Vitamin (MULTIVITAMIN) tablet Take 1 tablet by mouth daily.     pantoprazole  (PROTONIX ) 20 MG tablet Take 1 tablet (20 mg total) by mouth daily. 90 tablet 3   PARoxetine  (PAXIL ) 40 MG tablet Take 1 tablet (40 mg total) by mouth every morning. 30 tablet 0   potassium chloride (MICRO-K) 10 MEQ CR capsule Take 1 capsule twice a day by oral route for 10 days.     No current facility-administered medications for this visit.     Musculoskeletal: Strength & Muscle Tone: {desc; muscle tone:32375} Gait & Station: {PE GAIT ED WJUO:77474} Patient leans: {Patient Leans:21022755}  Psychiatric Specialty Exam: Review of Systems  There were no vitals taken for this visit.There is no height or weight on file to calculate BMI.  General Appearance: {Appearance:22683}  Eye Contact:  {BHH EYE CONTACT:22684}  Speech:  {Speech:22685}  Volume:  {Volume (PAA):22686}  Mood:  {BHH MOOD:22306}  Affect:  {Affect (PAA):22687}  Thought Process:  {Thought Process (PAA):22688}  Orientation:  {BHH ORIENTATION (PAA):22689}  Thought Content: {Thought Content:22690}   Suicidal Thoughts:  {ST/HT (PAA):22692}  Homicidal Thoughts:  {ST/HT (PAA):22692}  Memory:  {BHH MEMORY:22881}  Judgement:  {Judgement (PAA):22694}  Insight:  {Insight (PAA):22695}  Psychomotor Activity:  {Psychomotor (PAA):22696}  Concentration:  {Concentration:21399}  Recall:  {BHH GOOD/FAIR/POOR:22877}  Fund of Knowledge: {BHH GOOD/FAIR/POOR:22877}  Language: {BHH GOOD/FAIR/POOR:22877}  Akathisia:  {BHH YES OR NO:22294}  Handed:  {Handed:22697}  AIMS (if indicated): {Desc; done/not:10129}  Assets:  {Assets  (PAA):22698}  ADL's:  {BHH JIO'D:77709}  Cognition: {chl bhh cognition:304700322}  Sleep:  {BHH GOOD/FAIR/POOR:22877}   Screenings: PHQ2-9    Flowsheet Row Office Visit from [facility]/18/[facility]  PHQ-2 Total Score 3  PHQ-9 Total Score 8     Assessment and Plan: ***  Collaboration of Care: Collaboration of Care: {BH OP Collaboration of Care:21014065}  Patient/Guardian was advised Release of Information must be obtained prior to any record release in order to collaborate their care with an outside provider. Patient/Guardian was  advised if they have not already done so to contact the registration department to sign all necessary forms in order for us  to release information regarding their care.   Consent: Patient/Guardian gives verbal consent for treatment and assignment of benefits for services provided during this visit. Patient/Guardian expressed understanding and agreed to proceed.    Staci LOISE Kerns, NP 03/21/2024, 3:43 PM

## 2024-03-21 NOTE — Progress Notes (Signed)
 error

## 2024-04-13 ENCOUNTER — Other Ambulatory Visit (HOSPITAL_COMMUNITY): Payer: Self-pay

## 2024-05-09 ENCOUNTER — Ambulatory Visit (HOSPITAL_COMMUNITY)
Admission: RE | Admit: 2024-05-09 | Discharge: 2024-05-09 | Disposition: A | Discharge status: Home / Self Care | Payer: Medicare Other | Source: Ambulatory Visit | Attending: Cardiology | Admitting: Cardiology

## 2024-05-09 DIAGNOSIS — I7121 Aneurysm of the ascending aorta, without rupture: Secondary | ICD-10-CM | POA: Insufficient documentation

## 2024-05-09 DIAGNOSIS — I77810 Thoracic aortic ectasia: Secondary | ICD-10-CM | POA: Diagnosis present

## 2024-05-09 LAB — ECHOCARDIOGRAM COMPLETE
Area-P 1/2: 4.19 cm2
S' Lateral: 2.45 cm

## 2024-05-16 ENCOUNTER — Telehealth (HOSPITAL_COMMUNITY): Admitting: Family

## 2024-05-16 DIAGNOSIS — F332 Major depressive disorder, recurrent severe without psychotic features: Secondary | ICD-10-CM | POA: Diagnosis not present

## 2024-05-16 MED ORDER — BUPROPION HCL 75 MG PO TABS: 75.0000 mg | ORAL_TABLET | Freq: Every day | ORAL | 0 refills | Status: DC

## 2024-05-16 NOTE — Progress Notes (Unsigned)
 Virtual Visit via Video Note  I connected with Sandra Willis on 05/16/24 at  3:30 PM EST by a video enabled telemedicine application and verified that I am speaking with the correct person using two identifiers.  Location: Patient: Home Provider: Office   I discussed the limitations of evaluation and management by telemedicine and the availability of in person appointments. The patient expressed understanding and agreed to proceed.   I discussed the assessment and treatment plan with the patient. The patient was provided an opportunity to ask questions and all were answered. The patient agreed with the plan and demonstrated an understanding of the instructions.   The patient was advised to call back or seek an in-person evaluation if the symptoms worsen or if the condition fails to improve as anticipated.  I provided 15 minutes of non-face-to-face time during this encounter.   Sandra LOISE Kerns, NP   BH MD/PA/NP OP Progress Note  05/16/2024 4:01 PM Sandra Willis  MRN:  [id number]  Chief Complaint: Medication management  HPI: Sandra Willis 68 year old female presents for medication management follow-up appointment.  Seen and evaluated via virtual platform caregility.   [name] carries a diagnosis related to depression and anxiety disorder.  Currently prescribed Paxil  40 mg daily.  Reports struggling with decreased motivation, low energy stated having  good and bad days.  Reports she has been followed by primary care to rule out low vitamin D  and/or any thyroid disorders.  States she has a diagnosis related to fatty liver disease unsure if she is having toxins buildup but she does have a follow-up appointment with endocrinologist January.    Discussed initiating low-dose Wellbutrin to help with reported symptoms.  She reports a good appetite.  States she is resting well throughout the night.   I been taking Paxil  for so long and has been adjusted multiple times maybe I need to change  medication.   Sandra currently rating her depression and anxiety 4 out of 10 with 10 being the worst.  No concerns related to suicidal or homicidal ideations.  Denied sleep disturbance or decreased appetite.  Consideration for following up with intensive outpatient programming and individual therapy.  Support, and encouragement reassurance was provided.   Visit Diagnosis:    ICD-10-CM   1. Severe episode of recurrent major depressive disorder, without psychotic features (HCC)  F33.2       Past Psychiatric History:   Past Medical History:  Past Medical History:  Diagnosis Date   Anxiety    Aortic atherosclerosis    Depression    Dermatitis    due to plants (poison ivy, sumac, oak)   Diabetes (HCC)    Fatty liver disease, nonalcoholic    Pt is under care of specialist   High blood pressure    High cholesterol    IBS (irritable bowel syndrome)    Low back pain    Plantar fasciitis    Varicose veins of left lower extremity    Vitamin D  deficiency     Past Surgical History:  Procedure Laterality Date   CARPAL TUNNEL RELEASE  2020   cataracts Bilateral    DILATION AND CURETTAGE OF UTERUS     ELBOW SURGERY     hemeroidectomy      Family Psychiatric History:   Family History:  Family History  Problem Relation Age of Onset   Cancer Mother    Diabetes Mother    Alcohol abuse Father    Heart disease Father    High  Cholesterol Father    Hypertension Father    Personality disorder Sister    Drug abuse Sister    Diabetes Sister    Alcohol abuse Brother    Colon cancer Neg Hx    Rectal cancer Neg Hx    Stomach cancer Neg Hx     Social History:  Social History   Socioeconomic History   Marital status: Married    Spouse name: Not on file   Number of children: 2   Years of education: Not on file   Highest education level: Not on file  Occupational History   Not on file  Tobacco Use   Smoking status: Never   Smokeless tobacco: Never  Vaping Use   Vaping status:  Never Used  Substance and Sexual Activity   Alcohol use: No    Comment: quit 2000   Drug use: No   Sexual activity: Yes    Partners: Male  Other Topics Concern   Not on file  Social History Narrative   Pt lives in [place] with her husband. Pt has 2 adult children and has been married 41 yrs. They got married on her 26th birthday. Born and raised in [place] by her parents. Pt has one older brother and one older sister. 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 Caffeine 2 cups coffee on sat/ sun.  Sodas 1-2 daily during week.   Education 12th grade.    Social Drivers of Corporate Investment Banker Strain: Not on file  Food Insecurity: No Food Insecurity (09/25/2021)   Received [facility]   Hunger Vital Sign    Within the past 12 months, you worried that your food would run out before you got the money to buy more.: Never true    Within the past 12 months, the food you bought just didn't last and you didn't have money to get more.: Never true  Transportation Needs: No Transportation Needs (09/25/2021)   Received from [facility]   PRAPARE - Transportation    Lack of Transportation (Medical): No    Lack of Transportation (Non-Medical): No  Physical Activity: Not on file  Stress: Not on file  Social Connections: Unknown (11/18/2021)   Received from [facility]   Social Network    Social Network: Not on file    Allergies:  Allergies  Allergen Reactions   Fenofibrate     Aching   Statins Other (See Comments)    Diffuse pain, Simvastatin   Bactrim [Sulfamethoxazole-Trimethoprim] Rash   Sulfa Antibiotics Rash    bactrim   Zithromax [Azithromycin] Rash    Metabolic Disorder Labs: No results found for: HGBA1C, MPG No results found for: PROLACTIN Lab Results  Component Value Date   CHOL 109 06/01/2023   TRIG 188 (H) 06/01/2023   HDL 51 06/01/2023   CHOLHDL 2.1 06/01/2023   LDLCALC 28 06/01/2023   No results found for: TSH  Therapeutic  Level Labs: No results found for: LITHIUM No results found for: VALPROATE No results found for: CBMZ  Current Medications: Current Outpatient Medications  Medication Sig Dispense Refill   buPROPion (WELLBUTRIN) 75 MG tablet Take 1 tablet (75 mg total) by mouth daily. 30 tablet 0   Alirocumab  (PRALUENT ) 75 MG/ML SOAJ Inject 1 mL (75 mg total) into the skin every 14 (fourteen) days. 6 mL 3   atenolol-chlorthalidone (TENORETIC) 50-25 MG tablet Take 0.5 tablets by mouth daily.     ergocalciferol  (VITAMIN D2) 50000  units capsule Take 50,000 Units by mouth once a week.     metFORMIN (GLUCOPHAGE) 500 MG tablet Take 500 mg by mouth 2 (two) times daily.     Multiple Vitamin (MULTIVITAMIN) tablet Take 1 tablet by mouth daily.     pantoprazole  (PROTONIX ) 20 MG tablet Take 1 tablet (20 mg total) by mouth daily. 90 tablet 3   PARoxetine  (PAXIL ) 40 MG tablet Take 1 tablet (40 mg total) by mouth every morning. 90 tablet 1   potassium chloride (MICRO-K) 10 MEQ CR capsule Take 1 capsule twice a day by oral route for 10 days.     No current facility-administered medications for this visit.     Musculoskeletal:  Psychiatric Specialty Exam: Review of Systems  There were no vitals taken for this visit.There is no height or weight on file to calculate BMI.  General Appearance: Casual  Eye Contact:  Good  Speech:  Clear and Coherent  Volume:  Normal  Mood:  Anxious and Depressed  Affect:  Congruent  Thought Process:  Coherent  Orientation:  Full (Time, Place, and Person)  Thought Content: Logical   Suicidal Thoughts:  No  Homicidal Thoughts:  No  Memory:  Immediate;   Good Recent;   Good  Judgement:  Good  Insight:  Good  Psychomotor Activity:  Normal  Concentration:  Concentration: Good  Recall:  Good  Fund of Knowledge: Good  Language: Good  Akathisia:  No  Handed:  Right  AIMS (if indicated): not done  Assets:  Communication Skills Desire for Improvement  ADL's:  Intact   Cognition: WNL  Sleep:  Good   Screenings: PHQ2-9    Flowsheet Row Office Visit from [facility]/18/20[facility]  PHQ-2 Total Score 3  PHQ-9 Total Score 8     Assessment and Plan:  Sandra Willis 68 year old female presents for medication management follow-up appointment.  Carries a diagnosis related to depression and anxiety.  States she is struggling with depressed mood, decreased energy and low motivation.  Reports she has been compliant with Paxil  40 mg daily.  States she continues to struggle with good and bad days.  Discussed initiating Wellbutrin 75 mg daily to help with reported symptoms.  Patient to follow-up 2 months for medication adherence/tolerability.  Discussed following up sooner if presenting with possible medication side effects side effects.  She was receptive to plan.  No concerns related to suicidal or homicidal ideations.  Rating her depression 4 out of 10 with 10 being the worst today.  Support and encouragement reassurance was provided.  Collaboration of Care: Collaboration of Care: Medication Management AEB initiated Wellbutrin 75 mg daily  Patient/Guardian was advised Release of Information must be obtained prior to any record release in order to collaborate their care with an outside provider. Patient/Guardian was advised if they have not already done so to contact the registration department to sign all necessary forms in order for us  to release information regarding their care.   Consent: Patient/Guardian gives verbal consent for treatment and assignment of benefits for services provided during this visit. Patient/Guardian expressed understanding and agreed to proceed.    Sandra LOISE Kerns, NP 05/16/2024, 4:01 PM

## 2024-05-31 ENCOUNTER — Ambulatory Visit: Attending: Cardiology | Admitting: Cardiology

## 2024-05-31 ENCOUNTER — Encounter: Payer: Self-pay | Admitting: Cardiology

## 2024-05-31 VITALS — BP 137/75 | HR 71 | Ht 63.0 in | Wt 168.0 lb

## 2024-05-31 DIAGNOSIS — R931 Abnormal findings on diagnostic imaging of heart and coronary circulation: Secondary | ICD-10-CM | POA: Diagnosis present

## 2024-05-31 DIAGNOSIS — R0609 Other forms of dyspnea: Secondary | ICD-10-CM | POA: Diagnosis not present

## 2024-05-31 DIAGNOSIS — I2510 Atherosclerotic heart disease of native coronary artery without angina pectoris: Secondary | ICD-10-CM | POA: Insufficient documentation

## 2024-05-31 DIAGNOSIS — R0683 Snoring: Secondary | ICD-10-CM | POA: Insufficient documentation

## 2024-05-31 DIAGNOSIS — E782 Mixed hyperlipidemia: Secondary | ICD-10-CM | POA: Diagnosis present

## 2024-05-31 DIAGNOSIS — R5383 Other fatigue: Secondary | ICD-10-CM | POA: Insufficient documentation

## 2024-05-31 DIAGNOSIS — I10 Essential (primary) hypertension: Secondary | ICD-10-CM | POA: Diagnosis present

## 2024-05-31 DIAGNOSIS — I25118 Atherosclerotic heart disease of native coronary artery with other forms of angina pectoris: Secondary | ICD-10-CM | POA: Insufficient documentation

## 2024-05-31 LAB — LIPID PANEL

## 2024-05-31 MED ORDER — METOPROLOL TARTRATE 50 MG PO TABS: 50.0000 mg | ORAL_TABLET | Freq: Once | ORAL | 0 refills | Status: AC

## 2024-05-31 NOTE — Patient Instructions (Addendum)
 Medication Instructions:  ON DAY OF CT SCAN: Metoprolol  50 mg    *If you need a refill on your cardiac medications before your next appointment, please call your pharmacy*  Lab Work: BMP PROBNP LIPID PANEL   If you have labs (blood work) drawn today and your tests are completely normal, you will receive your results only by: MyChart Message (if you have MyChart) OR A paper copy in the mail If you have any lab test that is abnormal or we need to change your treatment, we will call you to review the results.  Testing/Procedures: [place]  Your physician has recommended that you have a sleep study. This test records several body functions during sleep, including: brain activity, eye movement, oxygen and carbon dioxide blood levels, heart rate and rhythm, breathing rate and rhythm, the flow of air through your mouth and nose, snoring, body muscle movements, and chest and belly movement.    Coronary cta- Your physician has requested that you have coronary  CTA. Coronary computed tomography (CT)angiogram  is a special type of CT scan that uses a computer to produce multi-dimensional views of major blood vessels throughout the heart.  CT angiography, a contrast material is injected through an IV to help visualize the blood vessels  a painless test that uses an x-ray machine to take clear, detailed pictures of your heart arteries .  Please follow instruction sheet as given.   Follow-Up: At [facility], you and your health needs are our priority.  As part of our continuing mission to provide you with exceptional heart care, our providers are all part of one team.  This team includes your primary Cardiologist (physician) and Advanced Practice Providers or APPs (Physician Assistants and Nurse Practitioners) who all work together to provide you with the care you need, when you need it.  Your next appointment:   3 month(s)  Provider:   Newman JINNY Lawrence, MD          Your cardiac CT  will be scheduled at  the below locations:     [facility] [address]  [place], [place] [address]     If scheduled at the [facility] at [address], please enter the parking lot using the [address] entrance and use the FREE valet service at the patient drop-off area. Enter the building and check-in with registration on the main floor.    Please follow these instructions carefully (unless otherwise directed):  An IV will be required for this test and Nitroglycerin will be given.    On the Night Before the Test: Be sure to Drink plenty of water. Do not consume any caffeinated/decaffeinated beverages or chocolate 12 hours prior to your test. Do not take any antihistamines 12 hours prior to your test.   On the Day of the Test: Drink plenty of water until 1 hour prior to the test. Do not eat any food 1 hour prior to test. You may take your regular medications prior to the test.  Take metoprolol  (Lopressor ) 50 mg two hours prior to test. Please HOLD  Atenolol- Chlorthalidone,on the morning of the test. FEMALES- please wear underwire-free bra if available, avoid dresses & tight clothing        After the Test: Drink plenty of water. After receiving IV contrast, you may experience a mild flushed feeling. This is normal. On occasion, you may experience a mild rash up to 24 hours after the test. This is not  dangerous. If this occurs, you can take Benadryl 25 mg, Zyrtec, Claritin, or Allegra and increase your fluid intake. (Patients taking Tikosyn should avoid Benadryl, and may take Zyrtec, Claritin, or Allegra) If you experience trouble breathing, this can be serious. If it is severe call 911 IMMEDIATELY. If it is mild, please call our office.  We will call to schedule your test 2-4 weeks out understanding that some insurance companies will need an authorization prior to the service being performed.   For more information and  frequently asked questions, please visit our website : [email or web address]  For non-scheduling related questions, please contact the cardiac imaging nurse navigator should you have any questions/concerns: Cardiac Imaging Nurse Navigators Direct Office Dial: [phone]   For scheduling needs, including cancellations and rescheduling, please call Brittany, [phone].

## 2024-05-31 NOTE — Progress Notes (Signed)
 Cardiology Office Note:  .   Date:  05/31/2024  ID:  [name], DOB [birth year], MRN [id number] PCP: Rosamond Leta NOVAK, MD  [facility] Providers Cardiologist:  Newman Lawrence, MD PCP: Rosamond Leta NOVAK, MD  Chief Complaint  Patient presents with   Dyspnea on exertion      History of Present Illness: Sandra Willis is a 68 y.o. female with hypertension, type 2 DM, mixed hyperlipidemia, aortic atherosclerosis, ascending aorta aneurysm  Patient has recently noticed dyspnea on exertion symptoms with minimal activity.  She denies any chest pain.  Separately, she states fatigued all day.  She does endorse snoring at night.  She has never had a sleep study before.    Vitals:   05/31/24 1131  BP: 137/75  Pulse: 71  SpO2: 95%      ROS:  Review of Systems  Constitutional: Positive for malaise/fatigue.  Cardiovascular:  Positive for dyspnea on exertion. Negative for chest pain, leg swelling, palpitations and syncope.     Studies Reviewed: SABRA        EKG 05/31/2024: Normal sinus rhythm Left ventricular hypertrophy with repolarization abnormality ( R in aVL ) No previous ECGs available    Echocardiogram 05/2024: 1. Left ventricular ejection fraction, by estimation, is 55 to 60%. Left  ventricular ejection fraction by 3D volume is 55 %. The left ventricle has  normal function. The left ventricle has no regional wall motion  abnormalities. There is moderate left  ventricular hypertrophy of the basal-septal segment. Left ventricular  diastolic parameters are consistent with Grade I diastolic dysfunction  (impaired relaxation).   2. Right ventricular systolic function is normal. The right ventricular  size is normal. There is normal pulmonary artery systolic pressure. The  estimated right ventricular systolic pressure is 27.9 mmHg.   3. The mitral valve is degenerative. Mild mitral valve regurgitation. No  evidence of mitral stenosis.   4. The aortic valve is  tricuspid. Aortic valve regurgitation is not  visualized. Aortic valve sclerosis is present, with no evidence of aortic  valve stenosis.   5. The inferior vena cava is normal in size with <50% respiratory  variability, suggesting right atrial pressure of 8 mmHg.   CTA aorta 11/23/2022: 1. Stable uncomplicated fusiform ectasia of the ascending thoracic aorta measuring 38 mm in diameter, unchanged compared to the 11/2020 examination. 2. Scattered atherosclerotic plaque within the thoracic aorta. Aortic Atherosclerosis (ICD10-I70.0). 3. Redemonstrated hepatic steatosis and previously characterized hepatic hemangiomas, better evaluated on abdominal MRI performed 09/02/2021 and 12/14/2020.  Labs 02/2024: HbA1C 6.5% Hb 15.8 Cr 0.67, AST/ALT 41/52   05/2023: Chol 109, TG 188, HDL 51, LDL 28    Physical Exam:   Physical Exam Vitals and nursing note reviewed.  Constitutional:      General: She is not in acute distress. Neck:     Vascular: No JVD.  Cardiovascular:     Rate and Rhythm: Normal rate and regular rhythm.     Heart sounds: Normal heart sounds. No murmur heard. Pulmonary:     Effort: Pulmonary effort is normal.     Breath sounds: Normal breath sounds. No wheezing or rales.  Musculoskeletal:     Right lower leg: No edema.     Left lower leg: No edema.      VISIT DIAGNOSES:   ICD-10-CM   1. Mixed hyperlipidemia  E78.2 EKG 12-Lead    CT CORONARY MORPH W/CTA COR W/SCORE W/CA W/CM &/OR WO/CM    Pro  b natriuretic peptide (BNP)    Basic metabolic panel with GFR    Lipid panel    2. Dyspnea on exertion  R06.09 CT CORONARY MORPH W/CTA COR W/SCORE W/CA W/CM &/OR WO/CM    3. Elevated coronary artery calcium score  R93.1 EKG 12-Lead    CT CORONARY MORPH W/CTA COR W/SCORE W/CA W/CM &/OR WO/CM    Pro b natriuretic peptide (BNP)    Basic metabolic panel with GFR    Lipid panel    4. Essential hypertension  I10 EKG 12-Lead    CT CORONARY MORPH W/CTA COR W/SCORE W/CA  W/CM &/OR WO/CM    Pro b natriuretic peptide (BNP)    Basic metabolic panel with GFR    Lipid panel    5. Coronary artery disease involving native coronary artery of native heart with other form of angina pectoris  I25.118 CT CORONARY MORPH W/CTA COR W/SCORE W/CA W/CM &/OR WO/CM    6. Fatigue, unspecified type  R53.83 [employer] Sleep Study    7. Snoring  R06.83         ASSESSMENT AND PLAN: .    Sandra Willis is a 68 y.o. female with hypertension, type 2 DM, mixed hyperlipidemia, aortic atherosclerosis, ascending aorta aneurysm   Dyspnea on exertion: Recent echocardiogram underwhelming to explain her dyspnea symptoms.  Check proBNP. Recent symptoms.  Risk factors for CAD including hypertension, type 2 diabetes mellitus, known aortic atherosclerosis. Consider angina equivalent as possible etiology. Recommend coronary CT angiogram.  Ascending aorta aneurysm: Stable fusiform aneurysm of ascending aorta, measuring 3.8 cm. Will reevaluate on upcoming coronary CT angiogram.   HR, BP controlled, on atenolol.   Mixed hyperlipidemia: Known h/o statin myopathy. Continue Praluent .   Check lipid panel.  Fatigue: With her known snoring, suspect sleep apnea as the etiology.  Recommend sleep study.   F/u in 3 months  Signed, Newman JINNY Lawrence, MD

## 2024-06-01 ENCOUNTER — Ambulatory Visit: Payer: Self-pay | Admitting: Cardiology

## 2024-06-01 DIAGNOSIS — E782 Mixed hyperlipidemia: Secondary | ICD-10-CM

## 2024-06-01 LAB — LIPID PANEL
Cholesterol, Total: 151 mg/dL (ref 100–199)
HDL: 53 mg/dL (ref >39)
LDL CALC COMMENT:: 2.8 ratio (ref 0.0–4.4)
LDL Chol Calc (NIH): 60 mg/dL (ref 0–99)
Triglycerides: 236 mg/dL — AB (ref 0–149)
VLDL Cholesterol Cal: 38 mg/dL (ref 5–40)

## 2024-06-01 LAB — BASIC METABOLIC PANEL WITH GFR
BUN/Creatinine Ratio: 25 (ref 12–28)
BUN: 17 mg/dL (ref 8–27)
CO2: 26 mmol/L (ref 20–29)
Calcium: 10.8 mg/dL — AB (ref 8.7–10.3)
Chloride: 96 mmol/L (ref 96–106)
Creatinine, Ser: 0.68 mg/dL (ref 0.57–1.00)
Glucose: 105 mg/dL — AB (ref 70–99)
Potassium: 3.6 mmol/L (ref 3.5–5.2)
Sodium: 140 mmol/L (ref 134–144)
eGFR: 96 mL/min/1.73 (ref >59)

## 2024-06-01 LAB — PRO B NATRIURETIC PEPTIDE: NT-Pro BNP: 58 pg/mL (ref 0–301)

## 2024-06-01 NOTE — Progress Notes (Signed)
 LDL is well-controlled, but triglycerides remain elevated.  If patient is open to taking low-dose statin, recommend Crestor 10 mg daily.  If statin is not acceptable, recommend fenofibrate 48 mg daily.  Repeat fasting lipid panel in 3 months.   Thanks MJP

## 2024-06-05 ENCOUNTER — Telehealth: Payer: Self-pay

## 2024-06-05 MED ORDER — FENOFIBRATE 48 MG PO TABS: 48.0000 mg | ORAL_TABLET | Freq: Every day | ORAL | 3 refills | Status: AC

## 2024-06-05 NOTE — Telephone Encounter (Signed)
  STOP BANG RISK ASSESSMENT S (snore) Have you been told that you snore?     YES   T (tired) Are you often tired, fatigued, or sleepy during the day?   YES  O (obstruction) Do you stop breathing, choke, or gasp during sleep? YES   P (pressure) Do you have or are you being treated for high blood pressure? YES   B (BMI) Is your body index greater than 35 kg/m? NO   A (age) Are you 67 years old or older? YES   N (neck) Do you have a neck circumference greater than 16 inches?   NO   G (gender) Are you a female? NO   TOTAL STOP/BANG "YES" ANSWERS 5

## 2024-06-05 NOTE — Progress Notes (Signed)
 Confirmed that only reaction to fenofibrate in the past was achy joints. Pt will contact office if unable to tolerate fenofibrate.

## 2024-06-05 NOTE — Telephone Encounter (Signed)
-----   Message from Nurse Avelina GAILS sent at 06/05/2024  8:02 AM EST ----- Regarding: Stop [name] [place] ordered a [employer]-HST at the pts f/u on 11/26 but no Stop Bang questionnaire was completed. Please complete the stop bang questionnaire and then let me know so I can forward it to the [employer] so they can do the PA and provide the pt with a device through the mail. Thank you, Macario

## 2024-06-07 ENCOUNTER — Other Ambulatory Visit (HOSPITAL_COMMUNITY): Payer: Self-pay | Admitting: Family

## 2024-06-14 ENCOUNTER — Other Ambulatory Visit (HOSPITAL_COMMUNITY): Payer: Self-pay | Admitting: *Deleted

## 2024-06-14 MED ORDER — BUPROPION HCL 75 MG PO TABS: 75.0000 mg | ORAL_TABLET | Freq: Every day | ORAL | 0 refills | Status: DC

## 2024-06-16 ENCOUNTER — Other Ambulatory Visit (HOSPITAL_COMMUNITY): Payer: Self-pay | Admitting: *Deleted

## 2024-06-16 ENCOUNTER — Encounter (HOSPITAL_COMMUNITY): Payer: Self-pay

## 2024-06-16 DIAGNOSIS — I77810 Thoracic aortic ectasia: Secondary | ICD-10-CM

## 2024-06-16 NOTE — Telephone Encounter (Signed)
 Attempted to reach out to patient as requested in phone call to front office on 12/11. This provider left HIPP compliant message.

## 2024-06-20 ENCOUNTER — Encounter (HOSPITAL_BASED_OUTPATIENT_CLINIC_OR_DEPARTMENT_OTHER): Payer: Self-pay | Admitting: Cardiology

## 2024-06-20 ENCOUNTER — Ambulatory Visit (HOSPITAL_COMMUNITY)
Admission: RE | Admit: 2024-06-20 | Discharge: 2024-06-20 | Disposition: A | Source: Ambulatory Visit | Attending: Cardiology

## 2024-06-20 DIAGNOSIS — R0609 Other forms of dyspnea: Secondary | ICD-10-CM | POA: Insufficient documentation

## 2024-06-20 DIAGNOSIS — I25118 Atherosclerotic heart disease of native coronary artery with other forms of angina pectoris: Secondary | ICD-10-CM | POA: Insufficient documentation

## 2024-06-20 DIAGNOSIS — E782 Mixed hyperlipidemia: Secondary | ICD-10-CM

## 2024-06-20 DIAGNOSIS — G4733 Obstructive sleep apnea (adult) (pediatric): Secondary | ICD-10-CM | POA: Diagnosis not present

## 2024-06-20 DIAGNOSIS — R931 Abnormal findings on diagnostic imaging of heart and coronary circulation: Secondary | ICD-10-CM | POA: Diagnosis present

## 2024-06-20 DIAGNOSIS — I10 Essential (primary) hypertension: Secondary | ICD-10-CM | POA: Diagnosis not present

## 2024-06-20 MED ORDER — NITROGLYCERIN 0.4 MG SL SUBL
0.8000 mg | SUBLINGUAL_TABLET | Freq: Once | SUBLINGUAL | Status: AC
  Administered 2024-06-20: 10:00:00 0.8 mg via SUBLINGUAL

## 2024-06-20 MED ORDER — IOHEXOL 350 MG/ML SOLN
100.0000 mL | Freq: Once | INTRAVENOUS | Status: AC | PRN
  Administered 2024-06-20: 11:00:00 100 mL via INTRAVENOUS

## 2024-06-21 NOTE — Progress Notes (Signed)
 Moderate nonobstructive coronary artery disease.  Continue Praluent .  Recommend adding aspirin  81 mg daily.  Thanks MJP

## 2024-06-22 ENCOUNTER — Ambulatory Visit (HOSPITAL_COMMUNITY)
Admission: RE | Admit: 2024-06-22 | Discharge: 2024-06-22 | Disposition: A | Source: Ambulatory Visit | Attending: Cardiology

## 2024-06-22 DIAGNOSIS — I77810 Thoracic aortic ectasia: Secondary | ICD-10-CM | POA: Insufficient documentation

## 2024-06-22 MED ORDER — ASPIRIN 81 MG PO TBEC: 81.0000 mg | DELAYED_RELEASE_TABLET | Freq: Every day | ORAL | Status: AC

## 2024-06-22 MED ORDER — IOHEXOL 350 MG/ML SOLN
75.0000 mL | Freq: Once | INTRAVENOUS | Status: AC | PRN
  Administered 2024-06-22: 13:00:00 75 mL via INTRAVENOUS

## 2024-06-22 NOTE — Procedures (Signed)
" ° ° ° ° °  SLEEP STUDY REPORT Patient Information Study Date: 06/20/2024 Patient Name: Sandra Willis Patient ID: [id number] Birth Date: 1955-11-28 Age: 68 Gender: Female BMI: 29.7 (W=167 lb, H=5' 3'') Referring Physician: Newman Lawrence, MD  TEST DESCRIPTION: Home sleep apnea testing was completed using the WatchPat, a Type 1 device, utilizing peripheral arterial tonometry (PAT), chest movement, actigraphy, pulse oximetry, pulse rate, body position and snore. AHI was calculated with apnea and hypopnea using valid sleep time as the denominator. RDI includes apneas, hypopneas, and RERAs. The data acquired and the scoring of sleep and all associated events were performed in accordance with the recommended standards and specifications as outlined in the AASM Manual for the Scoring of Sleep and Associated Events 2.2.0 (2015).  FINDINGS:  1. Severe Obstructive Sleep Apnea with AHI 35.9/hr.  2. Central Sleep Apnea with pAHIc 2.6/hr.  3. Oxygen desaturations as low as 71%.  4. Severe snoring was present. O2 sats were < 88% for 12.9 min.  5. Total sleep time was 8 hrs and 0 min.  6. 14% of total sleep time was spent in REM sleep.  7. Normal sleep onset latency at 23 min.  8. Prolonged REM sleep onset latency at 393 min.  9. Total awakenings were 10. 10. Arrhythmia detection: None  DIAGNOSIS: Severe Obstructive Sleep Apnea (G47.33) Nocturnal Hypoxemia  RECOMMENDATIONS: 1. Clinical correlation of these findings is necessary. The decision to treat obstructive sleep apnea (OSA) is usually based on the presence of apnea symptoms or the presence of associated medical conditions such as Hypertension, Congestive Heart Failure, Atrial Fibrillation or Obesity. The most common symptoms of OSA are snoring, gasping for breath while sleeping, daytime sleepiness and fatigue. 2. Initiating apnea therapy is recommended given the presence of symptoms and/or associated conditions. Recommend proceeding  with one of the following:  a. Auto-CPAP therapy with a pressure range of 5-20cm H2O.  b. An oral appliance (OA) that can be obtained from certain dentists with expertise in sleep medicine. These are primarily of use in non-obese patients with mild and moderate disease.  c. An ENT consultation which may be useful to look for specific causes of obstruction and possible treatment options.  d. If patient is intolerant to PAP therapy, consider referral to ENT for evaluation for hypoglossal nerve stimulator. 3. Close follow-up is necessary to ensure success with CPAP or oral appliance therapy for maximum benefit . 4. A follow-up oximetry study on CPAP is recommended to assess the adequacy of therapy and determine the need for supplemental oxygen or the potential need for Bi-level therapy. An arterial blood gas to determine the adequacy of baseline ventilation and oxygenation should also be considered. 5. Healthy sleep recommendations include: adequate nightly sleep (normal 7-9 hrs/night), avoidance of caffeine after noon and alcohol near bedtime, and maintaining a sleep environment that is cool, dark and quiet. 6. Weight loss for overweight patients is recommended. Even modest amounts of weight loss can significantly improve the severity of sleep apnea. 7. Snoring recommendations include: weight loss where appropriate, side sleeping, and avoidance of alcohol before bed. 8. Operation of motor vehicle should be avoided when sleepy.  Signature: Wilbert Bihari, MD; [facility]; Diplomat, American Board of Sleep Medicine Electronically Signed: 06/22/2024 7:03:13 PM  "

## 2024-06-26 NOTE — Progress Notes (Signed)
 MyChart message containing providers result note and interpretation read by patient on: Last read by Sandra Willis at 12:25AM on 06/26/2024.

## 2024-06-30 ENCOUNTER — Other Ambulatory Visit: Payer: Self-pay | Admitting: Internal Medicine

## 2024-06-30 DIAGNOSIS — Z1231 Encounter for screening mammogram for malignant neoplasm of breast: Secondary | ICD-10-CM

## 2024-07-04 ENCOUNTER — Inpatient Hospital Stay: Admission: RE | Admit: 2024-07-04 | Source: Ambulatory Visit

## 2024-07-07 ENCOUNTER — Other Ambulatory Visit: Payer: Self-pay

## 2024-07-07 ENCOUNTER — Other Ambulatory Visit (HOSPITAL_COMMUNITY): Payer: Self-pay

## 2024-07-10 ENCOUNTER — Other Ambulatory Visit (HOSPITAL_COMMUNITY): Payer: Self-pay | Admitting: *Deleted

## 2024-07-10 ENCOUNTER — Other Ambulatory Visit (HOSPITAL_COMMUNITY): Payer: Self-pay

## 2024-07-10 MED ORDER — BUPROPION HCL 75 MG PO TABS: 75.0000 mg | ORAL_TABLET | Freq: Every day | ORAL | 0 refills | Status: DC

## 2024-07-11 ENCOUNTER — Other Ambulatory Visit: Payer: Self-pay | Admitting: Orthopedic Surgery

## 2024-07-11 ENCOUNTER — Ambulatory Visit: Payer: Self-pay | Admitting: Cardiology

## 2024-07-11 ENCOUNTER — Telehealth: Payer: Self-pay | Admitting: Cardiology

## 2024-07-11 NOTE — Telephone Encounter (Signed)
"  ° °  Patient Name: Sandra Willis  DOB: [birth year] MRN: [id number]  Primary Cardiologist: Newman JINNY Lawrence, MD  Chart reviewed as part of pre-operative protocol coverage.  Patient was seen in clinic by Dr. Lawrence on 05/31/2024, patient underwent coronary CTA on 06/20/2024 that indicated moderate nonobstructive coronary artery disease. Per Dr. Lawrence, Low cardiac risk, okay to proceed.   Given past medical history and time since last visit, based on ACC/AHA guidelines, Sandra Willis is at acceptable risk for the planned procedure without further cardiovascular testing.   I will route this recommendation to the requesting party via Epic fax function and remove from pre-op pool.  Please call with questions.  Olive Motyka D Kouper Spinella, NP 07/11/2024, 12:07 PM  "

## 2024-07-11 NOTE — Telephone Encounter (Signed)
 Low cardiac risk, okay to proceed.  Thanks MJP

## 2024-07-11 NOTE — Telephone Encounter (Signed)
"  ° °  Pre-operative Risk Assessment    Patient Name: Sandra Willis  DOB: 08/14/1955 MRN: [id number]   Date of last office visit: 05/31/24 Date of next office visit: 08/30/24   Request for Surgical Clearance    Procedure:  left thumb A1 pulley release   Date of Surgery:  Clearance 07/21/24                                Surgeon:  Franky Curia Surgeon's Group or Practice Name:  [facility]  Phone number:  [phone] Fax number:  [phone]   Type of Clearance Requested:   - Medical    Type of Anesthesia:  Choice    Additional requests/questions:    [name]   07/11/2024, 10:54 AM   "

## 2024-07-17 ENCOUNTER — Telehealth (HOSPITAL_COMMUNITY): Admitting: Family

## 2024-07-17 ENCOUNTER — Other Ambulatory Visit: Payer: Self-pay

## 2024-07-17 ENCOUNTER — Encounter (HOSPITAL_BASED_OUTPATIENT_CLINIC_OR_DEPARTMENT_OTHER): Payer: Self-pay | Admitting: Orthopedic Surgery

## 2024-07-17 DIAGNOSIS — F411 Generalized anxiety disorder: Secondary | ICD-10-CM | POA: Diagnosis not present

## 2024-07-17 DIAGNOSIS — F332 Major depressive disorder, recurrent severe without psychotic features: Secondary | ICD-10-CM

## 2024-07-17 MED ORDER — BUPROPION HCL 75 MG PO TABS: 75.0000 mg | ORAL_TABLET | Freq: Every day | ORAL | 0 refills | Status: AC

## 2024-07-17 MED ORDER — PAROXETINE HCL 40 MG PO TABS: 40.0000 mg | ORAL_TABLET | ORAL | 1 refills | Status: AC

## 2024-07-17 NOTE — Progress Notes (Unsigned)
 [facility] MD/PA/NP OP Progress Note  07/17/2024 4:40 PM Sandra Willis  MRN:  [id number]  Chief Complaint: Medication management   HPI: Sandra Willis 69 year old female presents for medication management follow-up appointment.  Has a psychiatric diagnosis related to major depressive disorder and generalized anxiety disorder.  Per previous assessment patient had concerns related to taking Paxil  as she states she does not feel that medications is helping with her symptoms any longer.  Initiated Wellbutrin  75 mg daily to help with reported depressed mood, fatigue and low energy.     Sandra Willis reports she has had a follow-up appointment with sleep study and was advised that she has sleep apnea.  Anticipates that she will be prescribed breathing machine on follow-up appointment 07/31/2024 when she received her testing results.  Sandra Willis states feeling somewhat better with the Wellbutrin  75 mg addition.  No concerns related to suicidal or homicidal ideations.  Denies auditory visual hallucinations.  Will continue to monitor symptoms.  Support, encouragement  and reassurance was provided.  Visit Diagnosis:    ICD-10-CM   1. Severe episode of recurrent major depressive disorder, without psychotic features (HCC)  F33.2     2. GAD (generalized anxiety disorder)  F41.1       Past Psychiatric History: H/O major depressive disorder, generalized anxiety disorder and sleep disturbance  Past Medical History:  Past Medical History:  Diagnosis Date   Anxiety    Aortic atherosclerosis    Depression    Dermatitis    due to plants (poison ivy, sumac, oak)   Diabetes (HCC)    Fatty liver disease, nonalcoholic    Pt is under care of specialist   High blood pressure    High cholesterol    IBS (irritable bowel syndrome)    Low back pain    Plantar fasciitis    Varicose veins of left lower extremity    Vitamin D  deficiency     Past Surgical History:  Procedure Laterality Date   CARPAL TUNNEL RELEASE  2020   cataracts  Bilateral    DILATION AND CURETTAGE OF UTERUS     ELBOW SURGERY     hemeroidectomy      Family Psychiatric History:   Family History:  Family History  Problem Relation Age of Onset   Cancer Mother    Diabetes Mother    Alcohol abuse Father    Heart disease Father    High Cholesterol Father    Hypertension Father    Personality disorder Sister    Drug abuse Sister    Diabetes Sister    Alcohol abuse Brother    Colon cancer Neg Hx    Rectal cancer Neg Hx    Stomach cancer Neg Hx     Social History:  Social History   Socioeconomic History   Marital status: Married    Spouse name: Not on file   Number of children: 2   Years of education: Not on file   Highest education level: Not on file  Occupational History   Not on file  Tobacco Use   Smoking status: Never   Smokeless tobacco: Never  Vaping Use   Vaping status: Never Used  Substance and Sexual Activity   Alcohol use: No    Comment: quit 2000   Drug use: No   Sexual activity: Yes    Partners: Male  Other Topics Concern   Not on file  Social History Narrative   Pt lives in [place] with her husband. Pt has 2 adult  children and has been married 41 yrs. They got married on her 73th birthday. Born and raised in [place] by her parents. Pt has one older brother and one older sister. 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 Caffeine 2 cups coffee on sat/ sun.  Sodas 1-2 daily during week.   Education 12th grade.    Social Drivers of Health   Tobacco Use: Low Risk (07/17/2024)   Patient History    Smoking Tobacco Use: Never    Smokeless Tobacco Use: Never    Passive Exposure: Not on file  Financial Resource Strain: Not on file  Food Insecurity: No Food Insecurity (09/25/2021)   Received from [facility]   Epic    Within the past 12 months, you worried that your food would run out before you got the money to buy more.: Never true    Within the past 12 months, the food you bought just didn't last and  you didn't have money to get more.: Never true  Transportation Needs: No Transportation Needs (09/25/2021)   Received from [facility]   PRAPARE - Transportation    Lack of Transportation (Medical): No    Lack of Transportation (Non-Medical): No  Physical Activity: Not on file  Stress: Not on file  Social Connections: Unknown (11/18/2021)   Received from [facility]   Social Network    Social Network: Not on file  Depression (PHQ2-9): Medium Risk (05/18/2023)   Depression (PHQ2-9)    PHQ-2 Score: 8  Alcohol Screen: Not on file  Housing: Not on file (02/24/2024)  Utilities: Not on file  Health Literacy: Not on file    Allergies: Allergies[1]  Metabolic Disorder Labs: No results found for: HGBA1C, MPG No results found for: PROLACTIN Lab Results  Component Value Date   CHOL 151 05/31/2024   TRIG 236 (H) 05/31/2024   HDL 53 05/31/2024   CHOLHDL 2.8 05/31/2024   LDLCALC 60 05/31/2024   LDLCALC 28 06/01/2023   No results found for: TSH  Therapeutic Level Labs: No results found for: LITHIUM No results found for: VALPROATE No results found for: CBMZ  Current Medications: Current Outpatient Medications  Medication Sig Dispense Refill   Alirocumab  (PRALUENT ) 75 MG/ML SOAJ Inject 1 mL (75 mg total) into the skin every 14 (fourteen) days. 6 mL 3   aspirin  EC 81 MG tablet Take 1 tablet (81 mg total) by mouth daily. Swallow whole.     atenolol-chlorthalidone (TENORETIC) 50-25 MG tablet Take 0.5 tablets by mouth daily.     buPROPion  (WELLBUTRIN ) 75 MG tablet Take 1 tablet (75 mg total) by mouth daily. 90 tablet 0   ergocalciferol  (VITAMIN D2) 50000 units capsule Take 50,000 Units by mouth once a week.     fenofibrate  (TRICOR ) 48 MG tablet Take 1 tablet (48 mg total) by mouth daily. 90 tablet 3   metFORMIN (GLUCOPHAGE) 500 MG tablet Take 500 mg by mouth 2 (two) times daily.     metoprolol  tartrate (LOPRESSOR ) 50 MG tablet Take 1 tablet (50 mg total) by mouth once  for 1 dose. TAKE TWO HOURS PRIOR TO  SCHEDULE CARDIAC TEST 1 tablet 0   Multiple Vitamin (MULTIVITAMIN) tablet Take 1 tablet by mouth daily.     pantoprazole  (PROTONIX ) 20 MG tablet Take 1 tablet (20 mg total) by mouth daily. 90 tablet 3   PARoxetine  (PAXIL ) 40 MG tablet Take 1 tablet (40 mg total) by mouth every morning. 90 tablet 1   potassium  chloride (MICRO-K) 10 MEQ CR capsule Take 1 capsule twice a day by oral route for 10 days.     No current facility-administered medications for this visit.     Musculoskeletal: Virtual assessment  Psychiatric Specialty Exam: Review of Systems  Psychiatric/Behavioral:  Positive for sleep disturbance. Negative for suicidal ideas. The patient is nervous/anxious.   All other systems reviewed and are negative.   There were no vitals taken for this visit.There is no height or weight on file to calculate BMI.  General Appearance: Casual  Eye Contact:  Good  Speech:  Clear and Coherent  Volume:  Normal  Mood:  Anxious and Depressed  Affect:  Congruent  Thought Process:  Coherent  Orientation:  Full (Time, Place, and Person)  Thought Content: Logical   Suicidal Thoughts:  No  Homicidal Thoughts:  No  Memory:  Immediate;   Good Recent;   Good  Judgement:  Good  Insight:  Good  Psychomotor Activity:  Normal  Concentration:  Concentration: Good  Recall:  Good  Fund of Knowledge: Good  Language: Good  Akathisia:  No  Handed:  Right  AIMS (if indicated): not done  Assets:  Communication Skills Desire for Improvement  ADL's:  Intact  Cognition: WNL  Sleep:  Fair awaiting sleep apnea results   Screenings: PHQ2-9    Flowsheet Row Office Visit from [facility]/18/20[facility]  PHQ-2 Total Score 3  PHQ-9 Total Score 8     Assessment and Plan: Auriel Kist 69 year old female presents for medication management follow-up appointment.  Currently prescribed Paxil  40 mg daily which she reports that she has  been taking and tolerating well.  Previously initiated on Wellbutrin  75 mg daily stated she is tolerating medications well feels that her mood has stabilized somewhat.  Reports she has a follow-up appointment for her sleep study results to which she anticipates diagnosis related to sleep apnea.  No additional concerns noted at this visit.  Patient to follow-up with 2 months for medication adherence/tolerability.  Close monitoring with liver enzymes, serotonin syndrome and drug to drug interactions.  F/u for CMP Support, and encouragement reassurance was provided.  Collaboration of Care: Collaboration of Care: Medication Management AEB continue Paxil  and Wellbutrin  as directed.   Patient/Guardian was advised Release of Information must be obtained prior to any record release in order to collaborate their care with an outside provider. Patient/Guardian was advised if they have not already done so to contact the registration department to sign all necessary forms in order for us  to release information regarding their care.   Consent: Patient/Guardian gives verbal consent for treatment and assignment of benefits for services provided during this visit. Patient/Guardian expressed understanding and agreed to proceed.    Staci LOISE Kerns, NP 07/17/2024, 4:40 PM      [1]  Allergies Allergen Reactions   Fenofibrate      Aching   Statins Other (See Comments)    Diffuse pain, Simvastatin   Bactrim [Sulfamethoxazole-Trimethoprim] Rash   Sulfa Antibiotics Rash    bactrim   Zithromax [Azithromycin] Rash

## 2024-07-18 ENCOUNTER — Telehealth (HOSPITAL_COMMUNITY): Admitting: Family

## 2024-07-19 ENCOUNTER — Encounter (HOSPITAL_BASED_OUTPATIENT_CLINIC_OR_DEPARTMENT_OTHER)
Admission: RE | Admit: 2024-07-19 | Discharge: 2024-07-19 | Disposition: A | Discharge status: Home / Self Care | Source: Ambulatory Visit | Attending: Orthopedic Surgery | Admitting: Orthopedic Surgery

## 2024-07-19 DIAGNOSIS — F419 Anxiety disorder, unspecified: Secondary | ICD-10-CM | POA: Diagnosis not present

## 2024-07-19 DIAGNOSIS — F32A Depression, unspecified: Secondary | ICD-10-CM | POA: Diagnosis not present

## 2024-07-19 DIAGNOSIS — E119 Type 2 diabetes mellitus without complications: Secondary | ICD-10-CM | POA: Diagnosis not present

## 2024-07-19 DIAGNOSIS — R011 Cardiac murmur, unspecified: Secondary | ICD-10-CM | POA: Diagnosis not present

## 2024-07-19 DIAGNOSIS — I10 Essential (primary) hypertension: Secondary | ICD-10-CM | POA: Diagnosis not present

## 2024-07-19 DIAGNOSIS — Z7984 Long term (current) use of oral hypoglycemic drugs: Secondary | ICD-10-CM | POA: Diagnosis not present

## 2024-07-19 DIAGNOSIS — M65312 Trigger thumb, left thumb: Secondary | ICD-10-CM | POA: Diagnosis present

## 2024-07-19 DIAGNOSIS — G4730 Sleep apnea, unspecified: Secondary | ICD-10-CM | POA: Diagnosis not present

## 2024-07-19 DIAGNOSIS — G709 Myoneural disorder, unspecified: Secondary | ICD-10-CM | POA: Diagnosis not present

## 2024-07-19 DIAGNOSIS — I2510 Atherosclerotic heart disease of native coronary artery without angina pectoris: Secondary | ICD-10-CM | POA: Diagnosis not present

## 2024-07-19 LAB — BASIC METABOLIC PANEL WITH GFR
Anion gap: 15 (ref 5–15)
BUN: 19 mg/dL (ref 8–23)
CO2: 24 mmol/L (ref 22–32)
Calcium: 9.4 mg/dL (ref 8.9–10.3)
Chloride: 100 mmol/L (ref 98–111)
Creatinine, Ser: 0.79 mg/dL (ref 0.44–1.00)
GFR, Estimated: >60 mL/min
Glucose, Bld: 146 mg/dL — ABNORMAL HIGH (ref 70–99)
Potassium: 3.9 mmol/L (ref 3.5–5.1)
Sodium: 139 mmol/L (ref 135–145)

## 2024-07-21 ENCOUNTER — Ambulatory Visit (HOSPITAL_BASED_OUTPATIENT_CLINIC_OR_DEPARTMENT_OTHER): Admitting: Anesthesiology

## 2024-07-21 ENCOUNTER — Other Ambulatory Visit: Payer: Self-pay

## 2024-07-21 ENCOUNTER — Encounter (HOSPITAL_BASED_OUTPATIENT_CLINIC_OR_DEPARTMENT_OTHER): Payer: Self-pay | Admitting: Orthopedic Surgery

## 2024-07-21 ENCOUNTER — Ambulatory Visit (HOSPITAL_BASED_OUTPATIENT_CLINIC_OR_DEPARTMENT_OTHER)
Admission: RE | Admit: 2024-07-21 | Discharge: 2024-07-21 | Disposition: A | Discharge status: Home / Self Care | Attending: Orthopedic Surgery | Admitting: Orthopedic Surgery

## 2024-07-21 ENCOUNTER — Encounter (HOSPITAL_BASED_OUTPATIENT_CLINIC_OR_DEPARTMENT_OTHER): Admission: RE | Disposition: A | Payer: Self-pay | Source: Home / Self Care | Attending: Orthopedic Surgery

## 2024-07-21 DIAGNOSIS — I2510 Atherosclerotic heart disease of native coronary artery without angina pectoris: Secondary | ICD-10-CM | POA: Diagnosis not present

## 2024-07-21 DIAGNOSIS — Z01818 Encounter for other preprocedural examination: Secondary | ICD-10-CM

## 2024-07-21 DIAGNOSIS — M65312 Trigger thumb, left thumb: Secondary | ICD-10-CM

## 2024-07-21 DIAGNOSIS — I10 Essential (primary) hypertension: Secondary | ICD-10-CM | POA: Diagnosis not present

## 2024-07-21 DIAGNOSIS — F418 Other specified anxiety disorders: Secondary | ICD-10-CM

## 2024-07-21 DIAGNOSIS — F32A Depression, unspecified: Secondary | ICD-10-CM | POA: Insufficient documentation

## 2024-07-21 DIAGNOSIS — E119 Type 2 diabetes mellitus without complications: Secondary | ICD-10-CM | POA: Insufficient documentation

## 2024-07-21 DIAGNOSIS — F419 Anxiety disorder, unspecified: Secondary | ICD-10-CM | POA: Insufficient documentation

## 2024-07-21 DIAGNOSIS — R011 Cardiac murmur, unspecified: Secondary | ICD-10-CM | POA: Insufficient documentation

## 2024-07-21 DIAGNOSIS — G4730 Sleep apnea, unspecified: Secondary | ICD-10-CM | POA: Insufficient documentation

## 2024-07-21 DIAGNOSIS — G709 Myoneural disorder, unspecified: Secondary | ICD-10-CM | POA: Insufficient documentation

## 2024-07-21 DIAGNOSIS — Z7984 Long term (current) use of oral hypoglycemic drugs: Secondary | ICD-10-CM | POA: Insufficient documentation

## 2024-07-21 HISTORY — PX: TRIGGER FINGER RELEASE: SHX641

## 2024-07-21 LAB — GLUCOSE, CAPILLARY
Glucose-Capillary: 156 mg/dL — ABNORMAL HIGH (ref 70–99)
Glucose-Capillary: 118 mg/dL — ABNORMAL HIGH (ref 70–99)

## 2024-07-21 MED ORDER — ACETAMINOPHEN 500 MG PO TABS
1000.0000 mg | ORAL_TABLET | Freq: Once | ORAL | Status: AC
  Administered 2024-07-21: 1000 mg via ORAL

## 2024-07-21 MED ORDER — PHENYLEPHRINE HCL (PRESSORS) 10 MG/ML IV SOLN
INTRAVENOUS | Status: DC | PRN
  Administered 2024-07-21 (×2): 80 ug via INTRAVENOUS

## 2024-07-21 MED ORDER — BUPIVACAINE HCL (PF) 0.25 % IJ SOLN
INTRAMUSCULAR | Status: DC | PRN
  Administered 2024-07-21: 9 mL

## 2024-07-21 MED ORDER — ONDANSETRON HCL 4 MG/2ML IJ SOLN
INTRAMUSCULAR | Status: DC | PRN
  Administered 2024-07-21: 4 mg via INTRAVENOUS

## 2024-07-21 MED ORDER — CEFAZOLIN SODIUM-DEXTROSE 2-4 GM/100ML-% IV SOLN
2.0000 g | INTRAVENOUS | Status: AC
  Administered 2024-07-21: 2 g via INTRAVENOUS

## 2024-07-21 MED ORDER — 0.9 % SODIUM CHLORIDE (POUR BTL) OPTIME
TOPICAL | Status: DC | PRN
  Administered 2024-07-21: 1000 mL

## 2024-07-21 MED ORDER — PROPOFOL 10 MG/ML IV BOLUS
INTRAVENOUS | Status: AC
  Filled 2024-07-21: qty 20

## 2024-07-21 MED ORDER — BUPIVACAINE HCL (PF) 0.25 % IJ SOLN
INTRAMUSCULAR | Status: AC
  Filled 2024-07-21: qty 30

## 2024-07-21 MED ORDER — AMISULPRIDE (ANTIEMETIC) 5 MG/2ML IV SOLN: 10.0000 mg | Freq: Once | INTRAVENOUS | Status: DC | PRN

## 2024-07-21 MED ORDER — FENTANYL CITRATE (PF) 100 MCG/2ML IJ SOLN
INTRAMUSCULAR | Status: DC | PRN
  Administered 2024-07-21: 50 ug via INTRAVENOUS

## 2024-07-21 MED ORDER — OXYCODONE HCL 5 MG PO TABS: 5.0000 mg | ORAL_TABLET | Freq: Once | ORAL | Status: DC | PRN

## 2024-07-21 MED ORDER — MIDAZOLAM HCL 2 MG/2ML IJ SOLN
INTRAMUSCULAR | Status: AC
  Filled 2024-07-21: qty 2

## 2024-07-21 MED ORDER — CEFAZOLIN SODIUM-DEXTROSE 2-4 GM/100ML-% IV SOLN
INTRAVENOUS | Status: AC
  Filled 2024-07-21: qty 100

## 2024-07-21 MED ORDER — DEXAMETHASONE SOD PHOSPHATE PF 10 MG/ML IJ SOLN
INTRAMUSCULAR | Status: AC
  Filled 2024-07-21: qty 1

## 2024-07-21 MED ORDER — FENTANYL CITRATE (PF) 100 MCG/2ML IJ SOLN
INTRAMUSCULAR | Status: AC
  Filled 2024-07-21: qty 2

## 2024-07-21 MED ORDER — DEXAMETHASONE SOD PHOSPHATE PF 10 MG/ML IJ SOLN
INTRAMUSCULAR | Status: DC | PRN
  Administered 2024-07-21: 10 mg via INTRAVENOUS

## 2024-07-21 MED ORDER — OXYCODONE HCL 5 MG/5ML PO SOLN: 5.0000 mg | Freq: Once | ORAL | Status: DC | PRN

## 2024-07-21 MED ORDER — LIDOCAINE 2% (20 MG/ML) 5 ML SYRINGE
INTRAMUSCULAR | Status: DC | PRN
  Administered 2024-07-21: 70 mg via INTRAVENOUS

## 2024-07-21 MED ORDER — LIDOCAINE 2% (20 MG/ML) 5 ML SYRINGE
INTRAMUSCULAR | Status: AC
  Filled 2024-07-21: qty 5

## 2024-07-21 MED ORDER — PROPOFOL 10 MG/ML IV BOLUS
INTRAVENOUS | Status: DC | PRN
  Administered 2024-07-21: 120 mg via INTRAVENOUS

## 2024-07-21 MED ORDER — LACTATED RINGERS IV SOLN: INTRAVENOUS | Status: DC

## 2024-07-21 MED ORDER — MIDAZOLAM HCL 5 MG/5ML IJ SOLN
INTRAMUSCULAR | Status: DC | PRN
  Administered 2024-07-21: 2 mg via INTRAVENOUS

## 2024-07-21 MED ORDER — HYDROCODONE-ACETAMINOPHEN 5-325 MG PO TABS: 1.0000 | ORAL_TABLET | Freq: Four times a day (QID) | ORAL | 0 refills | Status: AC | PRN

## 2024-07-21 MED ORDER — FENTANYL CITRATE (PF) 100 MCG/2ML IJ SOLN: 25.0000 ug | INTRAMUSCULAR | Status: DC | PRN

## 2024-07-21 MED ORDER — ACETAMINOPHEN 500 MG PO TABS
ORAL_TABLET | ORAL | Status: AC
  Filled 2024-07-21: qty 2

## 2024-07-21 MED ORDER — ONDANSETRON HCL 4 MG/2ML IJ SOLN
INTRAMUSCULAR | Status: AC
  Filled 2024-07-21: qty 2

## 2024-07-21 NOTE — Anesthesia Preprocedure Evaluation (Signed)
"                                    Anesthesia Evaluation  Patient identified by MRN, date of birth, ID band Patient awake    Reviewed: Allergy & Precautions, NPO status , Patient's Chart, lab work & pertinent test results  Airway Mallampati: II  TM Distance: >3 FB Neck ROM: Full    Dental no notable dental hx. (+) Dental Advisory Given   Pulmonary sleep apnea , neg recent URI   Pulmonary exam normal breath sounds clear to auscultation       Cardiovascular hypertension, + CAD  Normal cardiovascular exam+ Valvular Problems/Murmurs MR  Rhythm:Regular Rate:Normal  Echo 05/2024  1. Left ventricular ejection fraction, by estimation, is 55 to 60%. Left  ventricular ejection fraction by 3D volume is 55 %. The left ventricle has  normal function. The left ventricle has no regional wall motion  abnormalities. There is moderate left  ventricular hypertrophy of the basal-septal segment. Left ventricular  diastolic parameters are consistent with Grade I diastolic dysfunction  (impaired relaxation).   2. Right ventricular systolic function is normal. The right ventricular  size is normal. There is normal pulmonary artery systolic pressure. The  estimated right ventricular systolic pressure is 27.9 mmHg.   3. The mitral valve is degenerative. Mild mitral valve regurgitation. No  evidence of mitral stenosis.   4. The aortic valve is tricuspid. Aortic valve regurgitation is not  visualized. Aortic valve sclerosis is present, with no evidence of aortic  valve stenosis.   5. The inferior vena cava is normal in size with <50% respiratory  variability, suggesting right atrial pressure of 8 mmHg.      Neuro/Psych  PSYCHIATRIC DISORDERS Anxiety Depression     Neuromuscular disease    GI/Hepatic negative GI ROS, Neg liver ROS,,,  Endo/Other  diabetes    Renal/GU negative Renal ROS     Musculoskeletal negative musculoskeletal ROS (+)    Abdominal  (+) + obese  Peds   Hematology negative hematology ROS (+)   Anesthesia Other Findings   Reproductive/Obstetrics                              Anesthesia Physical Anesthesia Plan  ASA: 3  Anesthesia Plan: General   Post-op Pain Management: Tylenol  PO (pre-op)*   Induction: Intravenous  PONV Risk Score and Plan: 3 and Ondansetron , Dexamethasone  and Treatment may vary due to age or medical condition  Airway Management Planned: LMA  Additional Equipment:   Intra-op Plan:   Post-operative Plan: Extubation in OR  Informed Consent: I have reviewed the patients History and Physical, chart, labs and discussed the procedure including the risks, benefits and alternatives for the proposed anesthesia with the patient or authorized representative who has indicated his/her understanding and acceptance.     Dental advisory given  Plan Discussed with: CRNA  Anesthesia Plan Comments:         Anesthesia Quick Evaluation  "

## 2024-07-21 NOTE — Transfer of Care (Signed)
 Immediate Anesthesia Transfer of Care Note  Patient: Sandra Willis  Procedure(s) Performed: RELEASE, A1 PULLEY, FOR TRIGGER FINGER (Left: Thumb)  Patient Location: PACU  Anesthesia Type:General  Level of Consciousness: sedated  Airway & Oxygen Therapy: Patient Spontanous Breathing and Patient connected to face mask oxygen  Post-op Assessment: Report given to RN and Post -op Vital signs reviewed and stable  Post vital signs: Reviewed and stable  Last Vitals:  Vitals Value Taken Time  BP 113/67 07/21/24 13:15  Temp    Pulse 67 07/21/24 13:19  Resp 21 07/21/24 13:19  SpO2 96 % 07/21/24 13:19  Vitals shown include unfiled device data.  Last Pain:  Vitals:   07/21/24 1120  TempSrc: Tympanic  PainSc: 5       Patients Stated Pain Goal: 8 (07/21/24 1120)  Complications: No notable events documented.

## 2024-07-21 NOTE — H&P (Signed)
 Sandra Willis is an 69 y.o. female.   Chief Complaint: trigger thumb HPI: 69 y.o. yo female with triggering of left thumb.  This has been injected without lasting resolution.  She wishes to proceed with surgical trigger release.   Allergies: Allergies[1]  Past Medical History:  Diagnosis Date   Anxiety    Aortic atherosclerosis    Depression    Dermatitis    due to plants (poison ivy, sumac, oak)   Diabetes (HCC)    Fatty liver disease, nonalcoholic    Pt is under care of specialist   High blood pressure    High cholesterol    IBS (irritable bowel syndrome)    Low back pain    Plantar fasciitis    Varicose veins of left lower extremity    Vitamin D  deficiency     Past Surgical History:  Procedure Laterality Date   CARPAL TUNNEL RELEASE  2020   cataracts Bilateral    DILATION AND CURETTAGE OF UTERUS     ELBOW SURGERY     hemeroidectomy      Family History: Family History  Problem Relation Age of Onset   Cancer Mother    Diabetes Mother    Alcohol abuse Father    Heart disease Father    High Cholesterol Father    Hypertension Father    Personality disorder Sister    Drug abuse Sister    Diabetes Sister    Alcohol abuse Brother    Colon cancer Neg Hx    Rectal cancer Neg Hx    Stomach cancer Neg Hx     Social History:   reports that she has never smoked. She has never used smokeless tobacco. She reports that she does not drink alcohol and does not use drugs.  Medications: Medications Prior to Admission  Medication Sig Dispense Refill   Alirocumab  (PRALUENT ) 75 MG/ML SOAJ Inject 1 mL (75 mg total) into the skin every 14 (fourteen) days. 6 mL 3   aspirin  EC 81 MG tablet Take 1 tablet (81 mg total) by mouth daily. Swallow whole.     atenolol-chlorthalidone (TENORETIC) 50-25 MG tablet Take 0.5 tablets by mouth daily.     buPROPion  (WELLBUTRIN ) 75 MG tablet Take 1 tablet (75 mg total) by mouth daily. 90 tablet 0   ergocalciferol  (VITAMIN D2) 50000 units capsule  Take 50,000 Units by mouth once a week.     fenofibrate  (TRICOR ) 48 MG tablet Take 1 tablet (48 mg total) by mouth daily. 90 tablet 3   metFORMIN (GLUCOPHAGE) 500 MG tablet Take 500 mg by mouth 2 (two) times daily.     Multiple Vitamin (MULTIVITAMIN) tablet Take 1 tablet by mouth daily.     pantoprazole  (PROTONIX ) 20 MG tablet Take 1 tablet (20 mg total) by mouth daily. 90 tablet 3   PARoxetine  (PAXIL ) 40 MG tablet Take 1 tablet (40 mg total) by mouth every morning. 90 tablet 1   potassium chloride (MICRO-K) 10 MEQ CR capsule Take 1 capsule twice a day by oral route for 10 days.     metoprolol  tartrate (LOPRESSOR ) 50 MG tablet Take 1 tablet (50 mg total) by mouth once for 1 dose. TAKE TWO HOURS PRIOR TO  SCHEDULE CARDIAC TEST 1 tablet 0    Results for orders placed or performed during the hospital encounter of 07/21/24 (from the past 48 hours)  Glucose, capillary     Status: Abnormal   Collection Time: 07/21/24 11:22 AM  Result Value Ref Range  Glucose-Capillary 156 (H) 70 - 99 mg/dL    Comment: Glucose reference range applies only to samples taken after fasting for at least 8 hours.    No results found.    Blood pressure 136/83, pulse 78, temperature 98 F (36.7 C), temperature source Tympanic, resp. rate 18, height 5' 3 (1.6 m), weight 75.8 kg, SpO2 100%.  General appearance: alert, cooperative, and appears stated age Head: Normocephalic, without obvious abnormality, atraumatic Neck: supple, symmetrical, trachea midline Extremities: Intact sensation and capillary refill all digits.  +epl/fpl/io.  No wounds.  Skin: Skin color, texture, turgor normal. No rashes or lesions Neurologic: Grossly normal Incision/Wound: none  Assessment/Plan Left thumb trigger digit.  Non operative and operative treatment options have been discussed with the patient and patient wishes to proceed with operative treatment. Risks, benefits and alternatives of surgery were discussed including risks of blood  loss, infection, damage to nerves/vessels/tendons/ligament/bone, failure of surgery, need for additional surgery, complication with wound healing, stiffness, recurrence.  She voiced understanding of these risks and elected to proceed.    Sandra Willis 07/21/2024, 12:35 PM      [1]  Allergies Allergen Reactions   Fenofibrate      Aching   Statins Other (See Comments)    Diffuse pain, Simvastatin   Bactrim [Sulfamethoxazole-Trimethoprim] Rash   Sulfa Antibiotics Rash    bactrim   Zithromax [Azithromycin] Rash

## 2024-07-21 NOTE — Anesthesia Procedure Notes (Signed)
 Procedure Name: LMA Insertion Date/Time: 07/21/2024 12:49 PM  Performed by: Julieanne Fairy BROCKS, CRNAPre-anesthesia Checklist: Patient identified, Emergency Drugs available, Suction available and Patient being monitored Patient Re-evaluated:Patient Re-evaluated prior to induction Oxygen Delivery Method: Circle system utilized Preoxygenation: Pre-oxygenation with 100% oxygen Induction Type: IV induction Ventilation: Mask ventilation without difficulty LMA: LMA inserted LMA Size: 4.0 Number of attempts: 1 Airway Equipment and Method: Bite block Placement Confirmation: positive ETCO2 Tube secured with: Tape Dental Injury: Teeth and Oropharynx as per pre-operative assessment

## 2024-07-21 NOTE — Discharge Instructions (Addendum)
 [facility] Instructions Hand Surgery  Wound Care: Keep your hand elevated above the level of your heart.  Do not allow it to dangle by your side.  Keep the dressing dry and do not remove it unless your doctor advises you to do so.  He will usually change it at the time of your post-op visit.  Moving your fingers is advised to stimulate circulation but will depend on the site of your surgery.  If you have a splint applied, your doctor will advise you regarding movement.  Activity: Do not drive or operate machinery today.  Rest today and then you may return to your normal activity and work as indicated by your physician.  Diet:  Drink liquids today or eat a light diet.  You may resume a regular diet tomorrow.    General expectations: Pain for two to three days. Fingers may become slightly swollen.  Call your doctor if any of the following occur: Severe pain not relieved by pain medication. Elevated temperature. Dressing soaked with blood. Inability to move fingers. White or bluish color to fingers.   No tylenol  until 5:20pm   Post Anesthesia Home Care Instructions  Activity: Get plenty of rest for the remainder of the day. A responsible individual must stay with you for 24 hours following the procedure.  For the next 24 hours, DO NOT: -Drive a car -Advertising copywriter -Drink alcoholic beverages -Take any medication unless instructed by your physician -Make any legal decisions or sign important papers.  Meals: Start with liquid foods such as gelatin or soup. Progress to regular foods as tolerated. Avoid greasy, spicy, heavy foods. If nausea and/or vomiting occur, drink only clear liquids until the nausea and/or vomiting subsides. Call your physician if vomiting continues.  Special Instructions/Symptoms: Your throat may feel dry or sore from the anesthesia or the breathing tube placed in your throat during surgery. If this causes discomfort, gargle with warm salt water. The  discomfort should disappear within 24 hours.  If you had a scopolamine patch placed behind your ear for the management of post- operative nausea and/or vomiting:  1. The medication in the patch is effective for 72 hours, after which it should be removed.  Wrap patch in a tissue and discard in the trash. Wash hands thoroughly with soap and water. 2. You may remove the patch earlier than 72 hours if you experience unpleasant side effects which may include dry mouth, dizziness or visual disturbances. 3. Avoid touching the patch. Wash your hands with soap and water after contact with the patch.

## 2024-07-21 NOTE — Op Note (Signed)
 07/21/2024 [facility] OPERATIVE REPORT   PREOPERATIVE DIAGNOSIS: Left trigger thumb  POSTOPERATIVE DIAGNOSIS:  Left trigger thumb  PROCEDURE: Left trigger thumb release  SURGEON:  Franky Curia, MD  ASSISTANT:  none  ANESTHESIA:  General  IV FLUIDS:  Per anesthesia flow sheet  ESTIMATED BLOOD LOSS:  Minimal  COMPLICATIONS:  None  SPECIMENS:  None  TOURNIQUET TIME: Left arm: 9 minutes at 250 mmHg  DISPOSITION:  Stable to PACU  LOCATION: [facility]  INDICATIONS: Sandra Willis is a 69 y.o. female with triggering of the thumb.  This has been injected without lasting resolution.  She wishes to proceed with surgical trigger release.  Risks, benefits and alternatives of surgery were discussed including the risk of blood loss, infection, damage to nerves, vessels, tendons, ligaments, bone, failure of surgery, need for additional surgery, complications with wound healing, continued pain, continued triggering and need for repeat surgery.  She voiced understanding of these risks and elected to proceed.  OPERATIVE COURSE:  After being identified preoperatively by myself, the patient and I agreed upon the procedure and site of procedure.  The surgical site was marked. Surgical consent had been signed. She was given IV Ancef  as preoperative antibiotic prophylaxis. She was transported to the operating room and placed on the operating room table in supine position with the left upper extremity on an arm board. General anesthesia was induced by the anesthesiologist.  The left upper extremity was prepped and draped in normal sterile orthopedic fashion. A surgical pause was performed between surgeons, anesthesia, and operating room staff, and all were in agreement as to the patient, procedure, and site of procedure.  Tourniquet at the proximal aspect of the extremity was inflated to 250 mmHg after exsanguination of the arm with an Esmarch bandage.  An incision was made  transversely at the MP flexion crease of the thumb.  This was made through the skin only.  Spreading technique was used.  The radial and ulnar digital nerves were identified and were protected throughout the case. The flexor sheath was identified.  The A1 pulley was identified.  It was sharply incised.  It was released in its entirety.  Care was taken to avoid any release of the oblique pulley. The thumb was placed through a range of motion, there was noted to be no catching.  The wound was copiously irrigated with sterile saline. It was then closed with 4-0 nylon in a horizontal mattress fashion.  The wound was injected with  0.25% plain Marcaine  to aid in postoperative analgesia.  It was then dressed with sterile Xeroform, 4x4s, and wrapped lightly with a Coban dressing.  Tourniquet was deflated at 9 minutes.  The fingertips were pink with brisk capillary refill after deflation of the tourniquet.  The operative drapes were broken down and the patient was awoken from anesthesia safely.  She was transferred back to the stretcher and taken to the PACU in stable condition.   I will see her back in the office in 1 week for postoperative followup.  I will give her a prescription for Norco 5/325 1 tab PO q6 hours prn pain, dispense #15.    Hamzah Savoca, MD Electronically signed, 07/21/24

## 2024-07-22 NOTE — Anesthesia Postprocedure Evaluation (Signed)
"   Anesthesia Post Note  Patient: CANTRELL MARTUS  Procedure(s) Performed: RELEASE, A1 PULLEY, FOR TRIGGER FINGER (Left: Thumb)     Patient location during evaluation: PACU Anesthesia Type: General Level of consciousness: sedated and patient cooperative Pain management: pain level controlled Vital Signs Assessment: post-procedure vital signs reviewed and stable Respiratory status: spontaneous breathing Cardiovascular status: stable Anesthetic complications: no   No notable events documented.  Last Vitals:  Vitals:   07/21/24 1345 07/21/24 1400  BP: 132/71 (!) 155/88  Pulse: 75 83  Resp: 15 16  Temp:  (!) 36.2 C  SpO2: 95% 95%    Last Pain:  Vitals:   07/21/24 1400  TempSrc:   PainSc: 0-No pain                 Norleen Pope      "

## 2024-07-23 ENCOUNTER — Encounter (HOSPITAL_BASED_OUTPATIENT_CLINIC_OR_DEPARTMENT_OTHER): Payer: Self-pay | Admitting: Orthopedic Surgery

## 2024-08-01 ENCOUNTER — Telehealth: Payer: Self-pay | Admitting: *Deleted

## 2024-08-01 DIAGNOSIS — R5383 Other fatigue: Secondary | ICD-10-CM

## 2024-08-01 DIAGNOSIS — G4733 Obstructive sleep apnea (adult) (pediatric): Secondary | ICD-10-CM

## 2024-08-01 DIAGNOSIS — I25118 Atherosclerotic heart disease of native coronary artery with other forms of angina pectoris: Secondary | ICD-10-CM

## 2024-08-01 DIAGNOSIS — R0683 Snoring: Secondary | ICD-10-CM

## 2024-08-01 DIAGNOSIS — I10 Essential (primary) hypertension: Secondary | ICD-10-CM

## 2024-08-01 NOTE — Telephone Encounter (Signed)
-----   Message from Wilbert Bihari, MD sent at 07/04/2024  2:17 PM EST ----- Please let patient know that they have sleep apnea.  Recommend therapeutic CPAP titration for treatment of patient's sleep disordered breathing.

## 2024-08-01 NOTE — Telephone Encounter (Signed)
 The patient has been notified of the result and verbalized understanding.  All questions (if any) were answered. Joshua Dalton Seip, CMA 08/01/2024 1:54 PM     Precert Cpap titration

## 2024-08-30 ENCOUNTER — Ambulatory Visit: Admitting: Cardiology
